# Patient Record
Sex: Female | Born: 1940 | Race: White | Hispanic: No | Smoking: Former smoker
Health system: Southern US, Community
[De-identification: ages and names within clinical notes are randomized; demographics above are authoritative.]

## PROBLEM LIST (undated history)

## (undated) DIAGNOSIS — T4145XA Adverse effect of unspecified anesthetic, initial encounter: Secondary | ICD-10-CM

## (undated) DIAGNOSIS — E785 Hyperlipidemia, unspecified: Secondary | ICD-10-CM

## (undated) DIAGNOSIS — T8859XA Other complications of anesthesia, initial encounter: Secondary | ICD-10-CM

## (undated) DIAGNOSIS — I471 Supraventricular tachycardia: Secondary | ICD-10-CM

## (undated) DIAGNOSIS — M1990 Unspecified osteoarthritis, unspecified site: Secondary | ICD-10-CM

## (undated) DIAGNOSIS — I8390 Asymptomatic varicose veins of unspecified lower extremity: Secondary | ICD-10-CM

## (undated) DIAGNOSIS — Z8719 Personal history of other diseases of the digestive system: Secondary | ICD-10-CM

## (undated) DIAGNOSIS — K469 Unspecified abdominal hernia without obstruction or gangrene: Secondary | ICD-10-CM

## (undated) DIAGNOSIS — C801 Malignant (primary) neoplasm, unspecified: Secondary | ICD-10-CM

## (undated) DIAGNOSIS — K8010 Calculus of gallbladder with chronic cholecystitis without obstruction: Secondary | ICD-10-CM

## (undated) HISTORY — PX: MELANOMA EXCISION: SHX5266

## (undated) HISTORY — DX: Hyperlipidemia, unspecified: E78.5

## (undated) HISTORY — DX: Unspecified abdominal hernia without obstruction or gangrene: K46.9

## (undated) HISTORY — PX: HERNIA REPAIR: SHX51

## (undated) HISTORY — DX: Supraventricular tachycardia: I47.1

## (undated) HISTORY — DX: Asymptomatic varicose veins of unspecified lower extremity: I83.90

## (undated) HISTORY — DX: Malignant (primary) neoplasm, unspecified: C80.1

---

## 1973-06-09 HISTORY — PX: HERNIA REPAIR: SHX51

## 1998-08-06 ENCOUNTER — Ambulatory Visit (HOSPITAL_COMMUNITY): Admission: RE | Admit: 1998-08-06 | Discharge: 1998-08-06 | Payer: Self-pay | Admitting: General Surgery

## 1998-08-13 ENCOUNTER — Ambulatory Visit (HOSPITAL_COMMUNITY): Admission: RE | Admit: 1998-08-13 | Discharge: 1998-08-13 | Payer: Self-pay | Admitting: General Surgery

## 1998-08-13 ENCOUNTER — Encounter: Payer: Self-pay | Admitting: General Surgery

## 1998-09-12 ENCOUNTER — Encounter: Admission: RE | Admit: 1998-09-12 | Discharge: 1998-10-11 | Payer: Self-pay | Admitting: Family Medicine

## 1999-01-02 ENCOUNTER — Inpatient Hospital Stay (HOSPITAL_COMMUNITY): Admission: RE | Admit: 1999-01-02 | Discharge: 1999-01-03 | Payer: Self-pay | Admitting: General Surgery

## 2000-02-03 ENCOUNTER — Ambulatory Visit: Admission: RE | Admit: 2000-02-03 | Discharge: 2000-02-03 | Payer: Self-pay | Admitting: General Surgery

## 2000-02-07 ENCOUNTER — Inpatient Hospital Stay (HOSPITAL_COMMUNITY): Admission: RE | Admit: 2000-02-07 | Discharge: 2000-02-09 | Payer: Self-pay | Admitting: General Surgery

## 2002-08-23 ENCOUNTER — Ambulatory Visit (HOSPITAL_COMMUNITY): Admission: RE | Admit: 2002-08-23 | Discharge: 2002-08-23 | Payer: Self-pay | Admitting: Family Medicine

## 2002-08-23 ENCOUNTER — Encounter: Payer: Self-pay | Admitting: Family Medicine

## 2003-01-23 ENCOUNTER — Encounter (INDEPENDENT_AMBULATORY_CARE_PROVIDER_SITE_OTHER): Payer: Self-pay | Admitting: Specialist

## 2003-01-23 ENCOUNTER — Ambulatory Visit (HOSPITAL_COMMUNITY): Admission: RE | Admit: 2003-01-23 | Discharge: 2003-01-23 | Payer: Self-pay | Admitting: Gastroenterology

## 2003-02-20 ENCOUNTER — Encounter: Payer: Self-pay | Admitting: Cardiology

## 2003-02-20 ENCOUNTER — Ambulatory Visit (HOSPITAL_COMMUNITY): Admission: RE | Admit: 2003-02-20 | Discharge: 2003-02-20 | Payer: Self-pay | Admitting: Cardiology

## 2004-02-21 ENCOUNTER — Ambulatory Visit (HOSPITAL_COMMUNITY): Admission: RE | Admit: 2004-02-21 | Discharge: 2004-02-21 | Payer: Self-pay | Admitting: General Surgery

## 2004-02-21 ENCOUNTER — Encounter (INDEPENDENT_AMBULATORY_CARE_PROVIDER_SITE_OTHER): Payer: Self-pay | Admitting: *Deleted

## 2004-11-20 ENCOUNTER — Other Ambulatory Visit: Admission: RE | Admit: 2004-11-20 | Discharge: 2004-11-20 | Payer: Self-pay | Admitting: Family Medicine

## 2005-06-09 DIAGNOSIS — I471 Supraventricular tachycardia, unspecified: Secondary | ICD-10-CM

## 2005-06-09 HISTORY — DX: Supraventricular tachycardia: I47.1

## 2005-06-09 HISTORY — DX: Supraventricular tachycardia, unspecified: I47.10

## 2006-02-17 ENCOUNTER — Encounter: Admission: RE | Admit: 2006-02-17 | Discharge: 2006-02-17 | Payer: Self-pay | Admitting: Gastroenterology

## 2006-02-27 ENCOUNTER — Ambulatory Visit: Payer: Self-pay | Admitting: Internal Medicine

## 2006-05-19 ENCOUNTER — Ambulatory Visit: Payer: Self-pay | Admitting: Cardiology

## 2007-05-20 ENCOUNTER — Encounter: Payer: Self-pay | Admitting: Internal Medicine

## 2007-05-20 DIAGNOSIS — E785 Hyperlipidemia, unspecified: Secondary | ICD-10-CM | POA: Insufficient documentation

## 2007-07-29 ENCOUNTER — Telehealth (INDEPENDENT_AMBULATORY_CARE_PROVIDER_SITE_OTHER): Payer: Self-pay | Admitting: *Deleted

## 2007-11-16 ENCOUNTER — Emergency Department (HOSPITAL_COMMUNITY): Admission: EM | Admit: 2007-11-16 | Discharge: 2007-11-16 | Payer: Self-pay | Admitting: Emergency Medicine

## 2010-10-25 NOTE — Op Note (Signed)
NAME:  Sara Bryan, Sara Bryan                     ACCOUNT NO.:  [id number]   MEDICAL RECORD NO.:  [id number]                   PATIENT TYPE:  AMB   LOCATION:  DAY                                  FACILITY:  [facility]   PHYSICIAN:  Angelia Mould. Derrell Lolling, M.D.             DATE OF BIRTH:  1941/04/19   DATE OF PROCEDURE:  02/21/2004  DATE OF DISCHARGE:                                 OPERATIVE REPORT   PREOPERATIVE DIAGNOSIS:  Melanoma of the right back, scapular area.   POSTOPERATIVE DIAGNOSIS:  Melanoma of the right back, scapular area.   OPERATION PERFORMED:  Wide local excision of melanoma of the back.   SURGEON:  Angelia Mould. Derrell Lolling, M.D.   ANESTHESIA:   INDICATIONS FOR PROCEDURE:  The patient is a 70 year old white female who  was evaluated recently for a large pigmented irregular lesion of the right  back overlying the scapular area.  This lesion is about 3 cm in diameter or  greater.  There was no ulceration or nodularity.  Dr. Terri Piedra performed a  punch biopsy and the pathology report showed that this was a Breslow's  measurement 0.22 mm depth.  No mitoses were identified.  I have examined the  patient and she was brought to the operating room for wide local excision.   DESCRIPTION OF PROCEDURE:  Following the induction of general endotracheal  anesthesia, the patient was placed in a prone position with her arms and  shoulders in neutral position.  The right posterior shoulder and scapular  area were prepped and draped in a sterile fashion.  The margins of the  melanoma were examined carefully and marked.  A 1.0 cm margin was then  marked.  A vertically oriented incision was then marked to encompass the  lesion and a 1.0 cm or greater margin in all dimensions.  This was a large  elliptical incision with a total length being about  14 or 15 cm in length.  The elliptical incision was then made and taken down into the deeper  subcutaneous tissue.  This skin and subcutaneous tissue was then  excised.  Medial and lateral margins of the specimen were marked with silk sutures and  the specimen was sent to the lab.  Skin flaps were undermined medially and  laterally to mobilize the skin.  Hemostasis was excellent and achieved with  electrocautery.  The wound was completely dry.  The skin was  closed with interrupted sutures of 3-0 nylon and skin staples.  This did not  appear to be under any unusual tension.  Clean bandage was placed.  The  patient was taken to the recovery room in stable condition.  The estimated  blood loss was about 15 to 20 mL.  Complications were none.  Sponge, needle  and instrument counts were correct.  Angelia Mould. Derrell Lolling, M.D.    HMI/MEDQ  D:  02/21/2004  T:  02/21/2004  Job:  [id number]   cc:   Gelene Mink A. Worthy Rancher, M.D.  [address] [place]  [place] [address]  Fax: [phone]   Meredith Staggers, M.D.  [address], [address]  [place]  [place] [address]  Fax: [phone]

## 2010-10-25 NOTE — Assessment & Plan Note (Signed)
[facility]                               PULMONARY OFFICE NOTE   NAME:Sara Bryan, Sara Bryan                  MRN:          [id number]  DATE:02/27/2006                            DOB:          [birth year]    REFERRING PHYSICIAN:  Graylin Shiver, M.D.   REASON FOR CONSULTATION:  Lung nodule.   HISTORY:  A 70 year old white female, remote smoker, who developed a  melanoma two years ago (unknown level) requiring resection over her right  shoulder posteriorly by Dr. Derrell Lolling, who now comes in after virtual  colonoscopy revealed a left lower lobe nodule, less than of only a  millimeter.  There were no other abnormalities seen.  The patient states she  is feeling fine, trying to lose weight with little success.  She denies any  pleuritic or exertional chest pain, cough, fevers, chills, sweats or leg  swelling or significant history of arthritis.   PAST MEDICAL HISTORY:  1. Significant for hyperlipidemia.  2. Melanoma as noted above.  3. Blepharoplasty.  4. She is also status post C-section.   ALLERGIES:  None known.   MEDICATIONS:  These on the worksheet correct, dated February 27, 2006.  Does not include any respiratory medications.   SOCIAL HISTORY:  She quit smoking 17 years ago.  She works in Air[employer].  She  denies any usual travel, pet or hobby exposure or occupational exposure.   FAMILY HISTORY:  Positive for rheumatism in her father and breast cancer in  her mother.   REVIEW OF SYSTEMS:  Taken in detail on the worksheet and negative except as  outlined above.   PHYSICAL EXAMINATION:  An obese, pleasant, elderly white female in no acute  distress.  She has stable vital signs.  HEENT:  Unremarkable.  Pharynx clear.  LUNGS:  Lung fields are clear bilaterally to auscultation and percussion.  HEART:  Regular rhythm without murmur, rub, or gallop.  ABDOMEN:  Soft, benign.  EXTREMITIES:  Warm without calf tenderness, cyanosis or clubbing.   LABORATORY DATA:  Oxygen saturation 96% on room air.  CT scan reviewed from  [facility] showing a single 1 mm left lower lobe nodule.   IMPRESSION:  Asymptomatic and extremely tiny left lower lobe nodule that is  not likely to be seen on any chest x-ray either now or using past films.  The best we can do is, now that we know about the nodule since she has been  a smoker, has a positive family history of cancer and melanoma, to repeat a  chest CT in about three months and then after that perhaps at a year as  recommended by radiology.  I assured her and her daughter that it is not  uncommon at all to see incidental tiny nodules and because it is so small  the only option is to do an excisional biopsy, which I do not believe would  be warranted.  The only other option to the above is for [place] to do an  excisional biopsy now, which I think would not be warranted based on the  risk/benefit analysis.  Charlaine Dalton. Sherene Sires, MD, [facility]   MBW/MedQ  DD:  02/27/2006  DT:  03/03/2006  Job #:  [id number]   cc:   Graylin Shiver, M.D.  Tally Joe, M.D.

## 2010-10-25 NOTE — Op Note (Signed)
[facility]  Patient:    KEIGHLEY, DECKMAN                  MRN: [id number] Proc. Date: 02/07/00 Adm. Date:  [id number] Attending:  Brandy Hale CC:         Meredith Staggers, M.D.   Operative Report  PREOPERATIVE DIAGNOSES:  Recurrent ventral hernia.  POSTOPERATIVE DIAGNOSIS:  Recurrent ventral hernia.  OPERATION PERFORMED:  Repair of recurrent ventral incisional hernia, with polypropylene mesh.  SURGEON:  Angelia Mould. Derrell Lolling, M.D.  FIRST ASSISTANT:  Timothy E. Earlene Plater, M.D.  OPERATIVE INDICATIONS:  This is a 70 year old white female, who has had multiple operations in the past.  She has had multiple cesarean sections.  She had a ventral hernia repair many years ago.  I have repaired a ventral hernia through a midline incision in 1998, which was very complex.  She also had a ventral hernia in the left lower quadrant, lateral to the previous repair (which was repaired with mesh).  She presents now with a new ventral hernia in the lower midline, just in the suprapubic area; and has a hernia in that location.  She is brought to the operating room electively  for repair of her symptomatic ventral hernia.  OPERATIVE TECHNIQUE:  Following the induction of general endotracheal anesthesia, the patients abdomen was prepped and draped in the sterile fashion.  Lower midline incision was made, essentially from the umbilicus down to the symphysis pubis.  Dissection was carried down into the subcutaneous tissue.  We identified at least two sheets of mesh; one vertically in the midline and one transversely in the left lower quadrant.  We found a complicated hernia sac between the lower edge of the mesh and symphysis pubis. We opened the hernia sac and found that it had multiple chambers.  We debrided the sac back and opened up all the chambers and debrided the epithelial tissue.  I could palpate inside the abdomen and did not feel any hernia  defect in the left lower quadrant or the right lower quadrant otherwise.  Once we debrided back the sac back to the mesh above and the fascia below, we closed the mesh above to the fascia below with interrupted sutures of 0 Prolene. This was closed transversely  and the mesh was not exposed on the bowel. There was peritoneal surface on the intestinal tract.  We undermined the subcutaneous tissues circumferentially, well out to identifiable lateral abdominal wall fascia bilaterally, inferiorly all the way down to the level of the inguinal ligament and over the top of the symphysis pubis.  Above we extended all the way up through the upper end of the mesh near the umbilicus.  Hemostasis was excellent, and achieved with electrocautery.  The wound was irrigated with saline.  We brought a large sheet of polypropylene mesh to the wound.  We cut a piece of mesh that was fairly circular in dimension, approximately 10 inches x 10 inches in dimension.  This was sutured in place with about 15-20 interrupted mattress sutures of 0 Prolene, and the mesh was further secured with a 5 mm scroll-type tacking device.  This covered the defect quite well. We had the mesh overlapping the symphysis pubis inferiorly and tacked down to the inguinal ligament on both sides and well out on the abdominal fascia laterally and superiorly.  The wound was irrigated with saline.  Two 10 mm Jackson-Pratt drains were placed on top of the mesh and brought  out through separate stab incisions superiorly.  The drains were sutured to the skin with nylon sutures, and then connected to suction bulbs.  The subcutaneous tissue was approximated with interrupted sutures of 2-0 Vicryl.  The skin was closed with skin staples. Clean bandages were placed and the patient taken to the recovery room in stable condition.  ESTIMATED BLOOD LOSS:  150 cc.  COMPLICATIONS:  None.  Sponge, needle and instrument counts were correct. DD:   02/07/00 TD:  02/07/00 Job: [id number]

## 2010-10-25 NOTE — Op Note (Signed)
   NAME:  Sara Bryan, Sara Bryan                     ACCOUNT NO.:  [id number]   MEDICAL RECORD NO.:  [id number]                   PATIENT TYPE:  AMB   LOCATION:  ENDO                                 [facility]ACILITY[facility]  [facility]   PHYSICIAN:  Graylin Shiver, M.D.                DATE OF BIRTH:  1940-10-24   DATE OF PROCEDURE:  01/23/2003  DATE OF DISCHARGE:                                 OPERATIVE REPORT   PROCEDURE:  Colonoscopy with polypectomy.   INDICATIONS FOR PROCEDURE:  Screening.   Informed consent was obtained after explanation of the risks of bleeding,  infection, and perforation.   PREMEDICATION:  Fentanyl 62.5 mcg IV, Versed 6 mg IV.   PROCEDURE:  With the patient in the left lateral decubitus position a rectal  exam was performed and no masses were felt.  The Olympus colonoscope was  inserted into the rectum and advanced throughout the colon.  The colon was  very tortuous.  The scope was advanced into the region of the proximal  transverse colon but despite all maneuvers with changing position of the  patient and applying pressure at different points of the abdomen, I could  not advance the scope beyond a certain point.  The patient has a midline  surgical scar from prior C-sections and incisional hernia repairs and I  believe that she must have adhesions which are adhering to the colon  preventing passage of the scope.  The scope was bought out.  The transverse  colon looked normal.  The descending colon looked normal.  In the sigmoid at  20 cm there was a 1-cm sessile polyp.  This was snared and removed by snare  cautery technique.  We needed to give the patient Glucagon prior to this  because of so much colonic spasm in the area.  The rectum looked normal.  She tolerated the procedure well without complications.   IMPRESSION:  Colonoscopy to the proximal transverse colon revealing a  sigmoid polyp which was snared and removed.   PLAN:  The pathology will be checked.  I will  order an air contrast barium  enema for 6-8 weeks from now.                                               Graylin Shiver, M.D.    SFG/MEDQ  D:  01/23/2003  T:  01/23/2003  Job:  [id number]   cc:   Stacie Acres. White, M.D.  [address]  [place]  [place] [address]  Fax: [phone]

## 2010-11-14 ENCOUNTER — Encounter (INDEPENDENT_AMBULATORY_CARE_PROVIDER_SITE_OTHER): Payer: Self-pay | Admitting: General Surgery

## 2011-01-23 ENCOUNTER — Encounter: Payer: Self-pay | Admitting: *Deleted

## 2011-01-28 ENCOUNTER — Encounter: Payer: Self-pay | Admitting: Cardiovascular Disease

## 2011-01-28 ENCOUNTER — Ambulatory Visit (INDEPENDENT_AMBULATORY_CARE_PROVIDER_SITE_OTHER): Payer: Medicare Other | Admitting: Cardiovascular Disease

## 2011-01-28 VITALS — BP 120/82 | HR 58 | Ht 66.5 in | Wt 188.0 lb

## 2011-01-28 DIAGNOSIS — I498 Other specified cardiac arrhythmias: Secondary | ICD-10-CM

## 2011-01-28 DIAGNOSIS — I471 Supraventricular tachycardia: Secondary | ICD-10-CM

## 2011-01-28 MED ORDER — PROPRANOLOL HCL 10 MG PO TABS: 10.0000 mg | ORAL_TABLET | Freq: Four times a day (QID) | ORAL | Status: DC | PRN

## 2011-01-28 NOTE — Progress Notes (Signed)
Sara Bryan Date of Birth  1941-05-09 [facility] [address]     [address] [place], [place]  [address] [phone]  Fax  [phone]  History of Present Illness:  Sara Bryan is a 70 year old with a history of supraventricular tachycardia. She is overall doing fairly well. She admits to eating a little bit of extra salt recently. Her blood pressure is a little higher than normal.  Overall she's feeling well. She's not had any episodes of chest pain, shortness breath, syncope, presyncope or palpitations.  Current Outpatient Prescriptions on File Prior to Visit  Medication Sig Dispense Refill  . Ascorbic Acid (VITAMIN C PO) Take by mouth daily. Take in winter       . CALCIUM PO Take by mouth daily.        . Cholecalciferol (VITAMIN D PO) Take 5,000 Units by mouth daily.        . Coenzyme Q10 (CO Q-10 PO) Take by mouth daily.        . Multiple Vitamin (MULTI-VITAMIN PO) Take by mouth daily.        . Omega-3 Fatty Acids (FISH OIL PO) Take by mouth daily.        Marland Kitchen POTASSIUM PO Take by mouth daily.        Marland Kitchen PROPRANOLOL HCL PO Take 1 tablet by mouth as needed.        . rosuvastatin (CRESTOR) 10 MG tablet Take 10 mg by mouth daily.          Allergies  Allergen Reactions  . Aspirin     Past Medical History  Diagnosis Date  . Hernia   . Cancer     Melonoma  . Supraventricular tachycardia   . Hyperlipidemia     Past Surgical History  Procedure Date  . Cesarean section   . Hernia repair   . Melanoma excision     from back in 2007    History  Smoking status  . Former Smoker -- 1.0 packs/day for 20 years  . Types: Cigarettes  . Quit date: 01/23/1980  Smokeless tobacco  . Never Used    History  Alcohol Use No    Family History  Problem Relation Age of Onset  . Cancer Mother     Breast  . Arthritis Father     RA  . Heart disease Father     cardiomegaly    Reviw of Systems:  Reviewed in the HPI.  All other  systems are negative.  Physical Exam: BP 120/82  Pulse 58  Ht 5' 6.5" (1.689 m)  Wt 188 lb (85.276 kg)  BMI 29.89 kg/m2 The patient is alert and oriented x 3.  The mood and affect are normal.   Skin: warm and dry.  Color is normal.    HEENT:   the sclera are nonicteric.  The mucous membranes are moist.  The carotids are 2+ without bruits.  There is no thyromegaly.  There is no JVD.    Lungs: clear.  The chest wall is non tender.    Heart: regular rate with a normal S1 and S2.  There are no murmurs, gallops, or rubs. The PMI is not displaced.     Abdomen: good bowel sounds.  There is no guarding or rebound.  There is no hepatosplenomegaly or tenderness.  There are no masses.   Extremities:  no clubbing, cyanosis, or edema.  The legs are without rashes.  The distal pulses are intact.  Neuro:  Cranial nerves II - XII are intact.  Motor and sensory functions are intact.    The gait is normal.  ECG: Sinus Bradycardia.  Assessment / Plan:

## 2011-01-28 NOTE — Assessment & Plan Note (Signed)
Sara Bryan is doing very well from a cardiac standpoint. She's not had any recurrent episodes of supraventricular tachycardia. We have refilled her prescription for Inderal. We've reviewed the Valsalva maneuver and the diving reflex. I'll see her again in one year for followup visit.

## 2011-01-29 ENCOUNTER — Encounter: Payer: Self-pay | Admitting: Cardiovascular Disease

## 2011-03-05 ENCOUNTER — Other Ambulatory Visit: Payer: Self-pay

## 2011-03-06 LAB — DIFFERENTIAL
Eosinophils Relative: 1
Lymphocytes Relative: 22
Lymphs Abs: 2.3
Neutro Abs: 7.2

## 2011-03-06 LAB — COMPREHENSIVE METABOLIC PANEL
AST: 30
Albumin: 3.6
CO2: 25
Calcium: 9
Creatinine, Ser: 0.74
GFR calc Af Amer: >60
GFR calc non Af Amer: >60

## 2011-03-06 LAB — MAGNESIUM: Magnesium: 2.2

## 2011-03-06 LAB — POCT CARDIAC MARKERS
CKMB, poc: 3.2
Troponin i, poc: <0.05

## 2011-03-06 LAB — CBC
MCHC: 34
MCV: 87.7
Platelets: 329

## 2011-03-07 ENCOUNTER — Other Ambulatory Visit: Payer: Self-pay | Admitting: Gastroenterology

## 2011-03-07 DIAGNOSIS — Q438 Other specified congenital malformations of intestine: Secondary | ICD-10-CM

## 2011-03-07 DIAGNOSIS — Z86010 Personal history of colonic polyps: Secondary | ICD-10-CM

## 2011-03-25 ENCOUNTER — Encounter (INDEPENDENT_AMBULATORY_CARE_PROVIDER_SITE_OTHER): Payer: Self-pay | Admitting: General Surgery

## 2011-03-25 ENCOUNTER — Ambulatory Visit (INDEPENDENT_AMBULATORY_CARE_PROVIDER_SITE_OTHER): Payer: Medicare Other | Admitting: General Surgery

## 2011-03-25 VITALS — BP 142/92 | HR 76 | Temp 97.4°F | Resp 16 | Ht 66.5 in | Wt 189.4 lb

## 2011-03-25 DIAGNOSIS — C4361 Malignant melanoma of right upper limb, including shoulder: Secondary | ICD-10-CM | POA: Insufficient documentation

## 2011-03-25 DIAGNOSIS — C4360 Malignant melanoma of unspecified upper limb, including shoulder: Secondary | ICD-10-CM

## 2011-03-25 NOTE — Progress Notes (Signed)
Chief Complaint  Patient presents with  . Other    est pt new prob- eval melanoma of right elbow    HPI Sara Bryan is a 70 y.o. female.    This patient is a 70 year old Caucasian female who has been a patient of mine for many years. 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 This was a 0.22 mm thickness and she did well from that. She is followed regularly by Dr. Para Skeans. Her last surveillance exam was in July of 2012.  One month ago she noticed a reddish irregular nodule on the lateral aspect of her right elbow. One of Dr. Dorita Sciara physician extenders did a shave biopsy of this on March 05, 2011. Pathology report shows a malignant melanoma, Breslow's measurement 1.75 mm, and deep margin involved. Progression, ulceration, and vascular invasion are absent. Pathologic stage TXXVIII, in fact.  The patient is aware of her diagnosis. She is appropriately concerned. She does not note any other lesions like this on her body. She is here to discuss surgical management. HPI  Past Medical History  Diagnosis Date  . Hernia   . Cancer     Melonoma  . Supraventricular tachycardia   . Hyperlipidemia     Past Surgical History  Procedure Date  . Cesarean section   . Melanoma excision     from back in 2007  . Hernia repair     abd wall    Family History  Problem Relation Age of Onset  . Cancer Mother     Breast  . Arthritis Father     RA  . Heart disease Father     cardiomegaly    Social History History  Substance Use Topics  . Smoking status: Former Smoker -- 1.0 packs/day for 20 years    Types: Cigarettes    Quit date: 01/23/1980  . Smokeless tobacco: Never Used  . Alcohol Use: No    Allergies  Allergen Reactions  . Aspirin     Current Outpatient Prescriptions  Medication Sig Dispense Refill  . Ascorbic Acid (VITAMIN C PO) Take by mouth daily. Take in winter       . CALCIUM PO Take by mouth daily.        . Cholecalciferol  (VITAMIN D PO) Take 5,000 Units by mouth daily.        . Coenzyme Q10 (CO Q-10 PO) Take by mouth daily.        . Multiple Vitamin (MULTI-VITAMIN PO) Take by mouth daily.        . Omega-3 Fatty Acids (FISH OIL PO) Take by mouth daily.        Marland Kitchen POTASSIUM PO Take by mouth daily.        . propranolol (INDERAL) 10 MG tablet Take 1 tablet (10 mg total) by mouth 4 (four) times daily as needed.  30 tablet  12  . PROPRANOLOL HCL PO Take 1 tablet by mouth as needed.        . rosuvastatin (CRESTOR) 10 MG tablet Take 10 mg by mouth daily.          Review of Systems Review of Systems  Constitutional: Negative.   HENT: Negative.   Respiratory: Negative.   Cardiovascular: Negative.   Genitourinary: Negative.   Musculoskeletal: Negative.   Skin: Positive for color change and wound. Negative for pallor and rash.  Neurological: Negative.   Hematological: Negative.   Psychiatric/Behavioral: Negative.     Blood pressure  142/92, pulse 76, temperature 97.4 F (36.3 C), temperature source Temporal, resp. rate 16, height 5' 6.5" (1.689 m), weight 189 lb 6.4 oz (85.911 kg).  Physical Exam Physical Exam  Constitutional: She is oriented to person, place, and time. She appears well-developed and well-nourished. No distress.  HENT:  Head: Normocephalic and atraumatic.  Nose: Nose normal.  Mouth/Throat: No oropharyngeal exudate.  Eyes: Conjunctivae and EOM are normal. Pupils are equal, round, and reactive to light. Right eye exhibits no discharge. Left eye exhibits no discharge. No scleral icterus.  Neck: Normal range of motion. Neck supple. No JVD present. No tracheal deviation present. No thyromegaly present.  Cardiovascular: Normal rate, regular rhythm, normal heart sounds and intact distal pulses.  Exam reveals no gallop and no friction rub.   No murmur heard. Pulmonary/Chest: Breath sounds normal. No stridor. No respiratory distress. She has no wheezes. She has no rales.   She exhibits no tenderness.    Abdominal: Soft. Bowel sounds are normal. She exhibits no distension and no mass. There is no tenderness. There is no rebound and no guarding.    Musculoskeletal: Normal range of motion. She exhibits no edema and no tenderness.  Lymphadenopathy:    She has no cervical adenopathy.  Neurological: She is alert and oriented to person, place, and time. She exhibits normal muscle tone. Coordination normal.  Skin: Skin is warm and dry. No rash noted. She is not diaphoretic. No erythema. No pallor.     Psychiatric: She has a normal mood and affect. Her behavior is normal. Judgment and thought content normal.    Data Reviewed I reviewed the pathology report and the note from Dr. Arvella Merles office.  Assessment    Malignant melanoma, lateral aspect right elbow, Breslow's measurement  1.75 mm, deep margin involved.  She will need  wide local excision of this as well as sentinel lymph node biopsy.  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  Status post multiple ventral hernia repairs with mesh.  Status post cesarean section x2.    Plan    Schedule for a lymphoscintigram to be sure that the melanoma maps to the right axilla.  Tentatively schedule for wide local excision of the melanoma of the right elbow and right axillary sentinel node biopsy.  If the lymphoscintigram maps to another lymph node, we will alter the treatment plan and discuss with her.  I have discussed the indications and details of surgery with the patient and her daughter. Risks and complications have been outlined, including but limited to bleeding, infection, reoperation for positive margins, reoperation for positive nodes, nerve damage chronic pain or numbness, arm swelling, cardiac pulmonary and thromboembolic problems. She seems to understand all these issues well. All of her questions were answered. She is in full agreement with this plan.  I have told her we may refer her  to a medical oncologist postop, depending on findings.       Sara Bryan M 03/25/2011, 3:06 PM

## 2011-03-25 NOTE — Patient Instructions (Signed)
You will be scheduled for a x-ray called a lymphoscintigram of the right upper extremity. 10-14 days after that x-ray we will perform your surgery, which will involve wide local excision of the melanoma of the right elbow and a sentinel lymph node biopsy of the right axilla. I will call you if the lymphoscintigram changes  that plan.  Melanoma Melanoma is the least common, but most dangerous, form of skin cancer. This is because it can spread (metastasize) to other organs and can be life-threatening. Melanoma is a cancerous (malignant) tumor that begins in a certain type of cells, called melanocytes. Melanocytes are the cells that produce the color (pigment) called melanin. Melanin colors our skin, hair, eyes, and moles. CAUSES The exact cause of melanoma is unknown. You may have a higher risk if you:  Spend or have spent a lot of time in the sun. This includes sunlamp and tanning booth exposure.   Have had sunburns. This put you at a particularly increased risk for melanoma. The more blistering sunburns a person has, the higher the risk.   Spend time in parts of the world with more intense sunlight.   Have fair skin that does not tan easily. You may have a lower risk if you have a darker skin color. However, people with darker skin can get melanoma, especially on the hands and feet (acral areas).   Have a close relative (parent, sibling) who has melanoma.   Have a large number of skin moles (more than 100).  SYMPTOMS A skin mole is suspicious if it has any of these 5 traits. This is called the ABCDE's of melanoma:  Asymmetry: Irregular shape, not simply round or oval.   Border: Edge of the mole is irregular, not smooth.   Color: Mole may have multiple colors in it, including brown, black, blue, red, or tan.   Diameter: More than 0.2 inches (6 mm) across.   Evolving: Any unusual change or symptoms in the mole, such as pain, itching, stinging, sensitivity, or bleeding.  A mole that is  noticeably changing in appearance, or any new mole, should be checked for melanoma. In general, people develop new moles until age 42. New moles after this age should be brought to the attention of your caregiver. DIAGNOSIS Your caregiver can look at your skin and find lesions or moles that may be suspicious. A patient may also notice a mole with symptoms or a mole that does not look like most of the other moles on his or her body. This is called the "ugly duckling" sign. A tissue sample (biopsy) examined under a microscope is needed to determine if it is melanoma. The size and extent of the biopsy will depend on the location, size, and appearance of the skin lesion or mole. The biopsy can also reveal whether melanoma has spread to deeper layers of the skin. TREATMENT Surgery to completely remove the melanoma is required. Lymph nodes may also be removed. If the melanoma has spread to other organs, such as the liver, lungs, bone, or brain, cancer-fighting drugs (chemotherapy) must be used. Your caregiver will discuss your treatment options with you. You can ask about being included in a clinical trial to evaluate new forms of treatment. Melanoma can occasionally recur years after the initial diagnosis. If you have melanoma, you will need follow-up visits with your caregiver for many years. PREVENTION Risk for melanoma can be reduced by minimizing sun exposure. Practice the 3 S's:  Slip on a shirt.   Slop  on sunscreen.   Slap on a hat.  Do not spend time in the sun during peak midafternoon hours. Sunscreen/sunblock with SPF 30 or higher and UVA/UVB block should be applied regularly. You should do this even during brief exposure to sunlight. You should also do this on cloudy days and in winter, even though the perceived sunlight is less. Always avoid sunburn! Wear sunglasses that block UV light. Be sure to see your caregiver if you have any new or changing moles. HOME CARE INSTRUCTIONS  Follow wound  care instructions after surgical removal of your melanoma.   Practice good sun avoidance and protective measures as described above.   Let your close family members (parents, children, siblings) know about your diagnosis. This puts them at a higher risk of getting melanoma than the general population.  SEEK MEDICAL CARE IF:  You notice any new moles, or you have any moles that are changing.   You have had a melanoma removed and you notice a new growth near the same location.   You have had a melanoma removed and you experience any new or unexplained health problems.  Document Released: 05/26/2005 Document Re-Released: 11/13/2009 [facility] Patient Information 2011 [place], [place].

## 2011-03-26 ENCOUNTER — Other Ambulatory Visit (INDEPENDENT_AMBULATORY_CARE_PROVIDER_SITE_OTHER): Payer: Self-pay | Admitting: General Surgery

## 2011-03-26 DIAGNOSIS — C439 Malignant melanoma of skin, unspecified: Secondary | ICD-10-CM

## 2011-03-27 ENCOUNTER — Encounter (HOSPITAL_COMMUNITY)
Admission: RE | Admit: 2011-03-27 | Discharge: 2011-03-27 | Disposition: A | Discharge status: Home / Self Care | Payer: Medicare Other | Source: Ambulatory Visit | Attending: General Surgery | Admitting: General Surgery

## 2011-03-27 DIAGNOSIS — C4361 Malignant melanoma of right upper limb, including shoulder: Secondary | ICD-10-CM

## 2011-03-27 DIAGNOSIS — C4360 Malignant melanoma of unspecified upper limb, including shoulder: Secondary | ICD-10-CM | POA: Insufficient documentation

## 2011-03-27 MED ORDER — TECHNETIUM TC 99M SULFUR COLLOID FILTERED
500.0000 | Freq: Once | INTRAVENOUS | Status: AC | PRN
  Administered 2011-03-27: 500 via INTRADERMAL

## 2011-03-31 ENCOUNTER — Telehealth (INDEPENDENT_AMBULATORY_CARE_PROVIDER_SITE_OTHER): Payer: Self-pay

## 2011-03-31 NOTE — Telephone Encounter (Signed)
Pt called re: having a vertual colonoscopy prior to her 11-7 melanoma. Per Dr Derrell Lolling unless pt is having GI symptoms or GI abnormality the colonoscopy should wait until after her melanoma surgery. I have lmom for pt to call. There was no name on answering machine.

## 2011-03-31 NOTE — Telephone Encounter (Signed)
Pt returning my call. Pt will r/s colonscopy since it is ltf appt.

## 2011-04-02 ENCOUNTER — Telehealth (INDEPENDENT_AMBULATORY_CARE_PROVIDER_SITE_OTHER): Payer: Self-pay

## 2011-04-02 NOTE — Telephone Encounter (Signed)
Tresa Endo, pt's daughter called to let us know they saw Dr Terri Piedra EA:[id number] on pts chest. He advised them that area was just a angioma but could be removed at time of melanoma surgery if Dr Derrell Lolling or pt wanted area gone. Pt wants to leave it up to Dr Derrell Lolling.

## 2011-04-07 ENCOUNTER — Other Ambulatory Visit: Payer: Medicare Other

## 2011-04-09 ENCOUNTER — Encounter (HOSPITAL_COMMUNITY): Payer: Self-pay

## 2011-04-14 ENCOUNTER — Other Ambulatory Visit: Payer: Self-pay

## 2011-04-14 ENCOUNTER — Encounter (HOSPITAL_COMMUNITY)
Admission: RE | Admit: 2011-04-14 | Discharge: 2011-04-14 | Disposition: A | Discharge status: Home / Self Care | Payer: Medicare Other | Source: Ambulatory Visit | Attending: General Surgery | Admitting: General Surgery

## 2011-04-14 ENCOUNTER — Encounter (HOSPITAL_COMMUNITY): Payer: Self-pay

## 2011-04-14 LAB — COMPREHENSIVE METABOLIC PANEL
ALT: 24 U/L (ref 0–35)
Albumin: 3.6 g/dL (ref 3.5–5.2)
Alkaline Phosphatase: 89 U/L (ref 39–117)
Glucose, Bld: 78 mg/dL (ref 70–99)
Potassium: 4 mEq/L (ref 3.5–5.1)
Sodium: 144 mEq/L (ref 135–145)
Total Protein: 7.3 g/dL (ref 6.0–8.3)

## 2011-04-14 LAB — DIFFERENTIAL
Basophils Relative: 0 % (ref 0–1)
Lymphs Abs: 3.2 10*3/uL (ref 0.7–4.0)
Monocytes Relative: 10 % (ref 3–12)
Neutro Abs: 7.9 10*3/uL — ABNORMAL HIGH (ref 1.7–7.7)
Neutrophils Relative %: 62 % (ref 43–77)

## 2011-04-14 LAB — URINALYSIS, ROUTINE W REFLEX MICROSCOPIC
Hgb urine dipstick: NEGATIVE
Specific Gravity, Urine: 1.026 (ref 1.005–1.030)
Urobilinogen, UA: 1 mg/dL (ref 0.0–1.0)

## 2011-04-14 LAB — CBC
Hemoglobin: 15.4 g/dL — ABNORMAL HIGH (ref 12.0–15.0)
MCHC: 34.4 g/dL (ref 30.0–36.0)
RBC: 4.98 MIL/uL (ref 3.87–5.11)
WBC: 12.6 10*3/uL — ABNORMAL HIGH (ref 4.0–10.5)

## 2011-04-14 NOTE — Pre-Procedure Instructions (Signed)
20 Sara Bryan  04/14/2011   Your procedure is scheduled [id number], November 7  Report to [facility] at 11 AM.  Call this number if you have problems the morning of surgery: [phone]   Remember:   Do not eat food:After Midnight.  Do not drink clear liquids: 4 Hours before arrival.  Take these medicines the morning of surgery with A SIP OF WATER: none   Do not wear jewelry, make-up or nail polish.  Do not wear lotions, powders, or perfumes. You may wear deodorant.  Do not shave 48 hours prior to surgery.  Do not bring valuables to the hospital.  Contacts, dentures or bridgework may not be worn into surgery.  Leave suitcase in the car. After surgery it may be brought to your room.  For patients admitted to the hospital, checkout time is 11:00 AM the day of discharge.   Patients discharged the day of surgery will not be allowed to drive home.  Name and phone number of your driver:daughter,kelly  Special Instructions: CHG Shower Use Special Wash: 1/2 bottle night before surgery and 1/2 bottle morning of surgery.   Please read over the following fact sheets that you were given: Pain Booklet, Coughing and Deep Breathing, MRSA Information and Surgical Site Infection Prevention

## 2011-04-15 ENCOUNTER — Other Ambulatory Visit (INDEPENDENT_AMBULATORY_CARE_PROVIDER_SITE_OTHER): Payer: Self-pay | Admitting: General Surgery

## 2011-04-15 MED ORDER — CEFAZOLIN SODIUM-DEXTROSE 2-3 GM-% IV SOLR
2.0000 g | INTRAVENOUS | Status: DC
  Filled 2011-04-15: qty 50

## 2011-04-15 NOTE — H&P (Signed)
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  Other       est pt new prob- eval melanoma of right elbow      HPI Sara Bryan is a 70 y.o. female.     This patient is a 70 year old Caucasian female who has been a patient of mine for many years. 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 This was a 0.22 mm thickness and she did well from that. She is followed regularly by Dr. Para Skeans. Her last surveillance exam was in July of 2012.   One month ago she noticed a reddish irregular nodule on the lateral aspect of her right elbow. One of Dr. Dorita Sciara physician extenders did a shave biopsy of this on March 05, 2011. Pathology report shows a malignant melanoma, Breslow's measurement 1.75 mm, and deep margin involved. Progression, ulceration, and vascular invasion are absent. Pathologic stage TXXVIII, in fact.   The patient is aware of her diagnosis. She is appropriately concerned. She does not note any other lesions like this on her body. She is here to discuss surgical management. HPI    Past Medical History   Diagnosis  Date   .  Hernia     .  Cancer         Melonoma   .  Supraventricular tachycardia     .  Hyperlipidemia         Past Surgical History   Procedure  Date   .  Cesarean  section     .  Melanoma excision         from back in 2007   .  Hernia repair         abd wall       Family History   Problem  Relation  Age of Onset   .  Cancer  Mother         Breast   .  Arthritis  Father         RA   .  Heart disease  Father         cardiomegaly      Social History History   Substance Use Topics   .  Smoking status:  Former Smoker -- 1.0 packs/day for 20 years       Types:  Cigarettes  Quit date:  01/23/1980   .  Smokeless tobacco:  Never Used   .  Alcohol Use:  No       Allergies   Allergen  Reactions   .  Aspirin         Current Outpatient Prescriptions   Medication  Sig  Dispense  Refill   .  Ascorbic Acid (VITAMIN C PO)  Take by mouth daily. Take in winter          .  CALCIUM PO  Take by mouth daily.           .  Cholecalciferol (VITAMIN D PO)  Take 5,000 Units by mouth daily.           .  Coenzyme Q10 (CO Q-10 PO)  Take by mouth daily.           .  Multiple Vitamin (MULTI-VITAMIN PO)  Take by mouth daily.           .  Omega-3 Fatty Acids (FISH OIL PO)  Take by mouth daily.           Marland Kitchen  POTASSIUM PO  Take by mouth daily.           .  propranolol (INDERAL) 10 MG tablet  Take 1 tablet (10 mg total) by mouth 4 (four) times daily as needed.   30 tablet   12   .  PROPRANOLOL HCL PO  Take 1 tablet by mouth as needed.           .  rosuvastatin (CRESTOR) 10 MG tablet  Take 10 mg by mouth daily.              Review of Systems Review of Systems  Constitutional: Negative.   HENT: Negative.   Respiratory: Negative.   Cardiovascular: Negative.   Genitourinary: Negative.   Musculoskeletal: Negative.   Skin: Positive for color change and wound. Negative for pallor and rash.  Neurological: Negative.   Hematological: Negative.   Psychiatric/Behavioral: Negative.     Blood pressure 142/92, pulse 76, temperature 97.4 F (36.3 C), temperature source Temporal, resp. rate 16, height 5' 6.5" (1.689 m), weight 189 lb 6.4 oz (85.911  kg).   Physical Exam Physical Exam  Constitutional: She is oriented to person, place, and time. She appears well-developed and well-nourished. No distress.  HENT:   Head: Normocephalic and atraumatic.   Nose: Nose normal.   Mouth/Throat: No oropharyngeal exudate.  Eyes: Conjunctivae and EOM are normal. Pupils are equal, round, and reactive to light. Right eye exhibits no discharge. Left eye exhibits no discharge. No scleral icterus.  Neck: Normal range of motion. Neck supple. No JVD present. No tracheal deviation present. No thyromegaly present.  Cardiovascular: Normal rate, regular rhythm, normal heart sounds and intact distal pulses.  Exam reveals no gallop and no friction rub.    No murmur heard. Pulmonary/Chest: Breath sounds normal. No stridor. No respiratory distress. She has no wheezes. She has no rales.   She exhibits no tenderness.  Abdominal: Soft. Bowel sounds are normal. She exhibits no distension and no mass. There is no tenderness. There is no rebound and no guarding.    Musculoskeletal: Normal range of motion. She exhibits no edema and no tenderness.  Lymphadenopathy:    She has no cervical adenopathy.  Neurological: She is alert and oriented to person, place, and time. She exhibits normal muscle tone. Coordination normal.  Skin: Skin is warm and dry. No rash noted. She is not  diaphoretic. No erythema. No pallor.     Psychiatric: She has a normal mood and affect. Her behavior is normal. Judgment and thought content normal.    Data Reviewed I reviewed the pathology report and the note from Dr. Arvella Merles office.   Assessment   Malignant melanoma, lateral aspect right elbow, Breslow's measurement  1.75 mm, deep margin involved.   She will need  wide local excision of this as well as sentinel lymph node biopsy.   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   Status post multiple ventral hernia repairs with  mesh.   Status post cesarean section x2.   Plan Schedule for a lymphoscintigram to be sure that the melanoma maps to the right axilla.   Tentatively schedule for wide local excision of the melanoma of the right elbow and right axillary sentinel node biopsy.   If the lymphoscintigram maps to another lymph node, we will alter the treatment plan and discuss with her.   I have discussed the indications and details of surgery with the patient and her daughter. Risks and complications have been outlined, including but limited to bleeding, infection, reoperation for positive margins, reoperation for positive nodes, nerve damage chronic pain or numbness, arm swelling, cardiac pulmonary and thromboembolic problems. She seems to understand all these issues well. All of her questions were answered. She is in full agreement with this plan.   I have told her we may refer her to a medical oncologist postop, depending on findings.       Remon Quinto M 03/25/2011, 3:06 PM                Not recorded         Orders Placed This Encounter       Future Orders    NM Lymph/Gland [[id number] Custom]    Expires: 05/24/12         Patient Instructions     You will be scheduled for a x-ray called a lymphoscintigram of the right upper extremity. 10-14 days after that x-ray we will perform your surgery, which will involve wide local excision of the melanoma of the right elbow and a sentinel lymph node biopsy of the right axilla. I will call you if the lymphoscintigram changes  that plan.   Melanoma  Melanoma is the least common, but most dangerous, form of skin cancer. This is because it can spread (metastasize) to other organs and can be life-threatening. Melanoma is a cancerous (malignant) tumor that begins in a certain type of cells, called melanocytes. Melanocytes are the cells that produce the color (pigment) called melanin. Melanin colors our skin, hair, eyes, and  moles. CAUSES  The exact cause of melanoma is unknown. You may have a higher risk if you: Spend or have spent a lot of time in the sun. This includes sunlamp and tanning booth exposure.   Have had sunburns. This put you at a particularly increased risk for melanoma. The more blistering sunburns a person has, the higher the risk.   Spend time in parts of the world with more intense sunlight.   Have fair skin that does not tan easily. You may have a lower risk if you have a darker skin color. However, people with darker skin can get melanoma, especially on the hands and feet (acral areas).   Have a close relative (parent, sibling) who has melanoma.   Have a large number of skin moles (more than 100).  SYMPTOMS A  skin mole is suspicious if it has any of these 5 traits. This is called the ABCDE's of melanoma: Asymmetry: Irregular shape, not simply round or oval.   Border: Edge of the mole is irregular, not smooth.   Color: Mole may have multiple colors in it, including brown, black, blue, red, or tan.   Diameter: More than 0.2 inches (6 mm) across.   Evolving: Any unusual change or symptoms in the mole, such as pain, itching, stinging, sensitivity, or bleeding.  A mole that is noticeably changing in appearance, or any new mole, should be checked for melanoma. In general, people develop new moles until age 63. New moles after this age should be brought to the attention of your caregiver. DIAGNOSIS Your caregiver can look at your skin and find lesions or moles that may be suspicious. A patient may also notice a mole with symptoms or a mole that does not look like most of the other moles on his or her body. This is called the "ugly duckling" sign. A tissue sample (biopsy) examined under a microscope is needed to determine if it is melanoma. The size and extent of the biopsy will depend on the location, size, and appearance of the skin lesion or mole. The biopsy can also reveal whether melanoma has spread  to deeper layers of the skin. TREATMENT Surgery to completely remove the melanoma is required. Lymph nodes may also be removed. If the melanoma has spread to other organs, such as the liver, lungs, bone, or brain, cancer-fighting drugs (chemotherapy) must be used. Your caregiver will discuss your treatment options with you. You can ask about being included in a clinical trial to evaluate new forms of treatment. Melanoma can occasionally recur years after the initial diagnosis. If you have melanoma, you will need follow-up visits with your caregiver for many years. PREVENTION Risk for melanoma can be reduced by minimizing sun exposure. Practice the 3 S's: Slip on a shirt.   Slop on sunscreen.   Slap on a hat.  Do not spend time in the sun during peak midafternoon hours. Sunscreen/sunblock with SPF 30 or higher and UVA/UVB block should be applied regularly. You should do this even during brief exposure to sunlight. You should also do this on cloudy days and in winter, even though the perceived sunlight is less. Always avoid sunburn! Wear sunglasses that block UV light. Be sure to see your caregiver if you have any new or changing moles. HOME CARE INSTRUCTIONS Follow wound care instructions after surgical removal of your melanoma.   Practice good sun avoidance and protective measures as described above.   Let your close family members (parents, children, siblings) know about your diagnosis. This puts them at a higher risk of getting melanoma than the general population.  SEEK MEDICAL CARE IF: You notice any new moles, or you have any moles that are changing.   You have had a melanoma removed and you notice a new growth near the same location.   You have had a melanoma removed and you experience any new or unexplained health problems.  Document Released: 05/26/2005 Document Re-Released: 11/13/2009 [facility] Patient Information 2011 [place], [place].       Level of Service     PR OFFICE/OUTPT  VISIT,EST,LEVL IV [id number]         All Flowsheet Templates (all recorded)     Encounter Vitals Flowsheet    Custom Formula Data Flowsheet    Anthropometrics Flowsheet  Referring Provider          Wynonia Sours       All Charges for This Encounter       Code Description Service Date Service Provider Modifiers Quantity    [id number] PR OFFICE/OUTPT VISIT,EST,LEVL IV 03/25/2011 Ernestene Mention, MD   1        Other Encounter Related Information     Allergies & Medications         Problem List         History         Patient-Entered Questionnaires     No data filed

## 2011-04-16 ENCOUNTER — Encounter (HOSPITAL_COMMUNITY): Payer: Self-pay | Admitting: Anesthesiology

## 2011-04-16 ENCOUNTER — Encounter (HOSPITAL_COMMUNITY): Payer: Self-pay | Admitting: *Deleted

## 2011-04-16 ENCOUNTER — Ambulatory Visit (HOSPITAL_COMMUNITY): Payer: Medicare Other | Admitting: Anesthesiology

## 2011-04-16 ENCOUNTER — Other Ambulatory Visit (INDEPENDENT_AMBULATORY_CARE_PROVIDER_SITE_OTHER): Payer: Self-pay | Admitting: General Surgery

## 2011-04-16 ENCOUNTER — Encounter (HOSPITAL_COMMUNITY)
Admission: RE | Disposition: A | Discharge status: Home / Self Care | Payer: Self-pay | Source: Ambulatory Visit | Attending: General Surgery

## 2011-04-16 ENCOUNTER — Ambulatory Visit (HOSPITAL_COMMUNITY)
Admission: RE | Admit: 2011-04-16 | Discharge: 2011-04-16 | Disposition: A | Discharge status: Home / Self Care | Payer: Medicare Other | Source: Ambulatory Visit | Attending: General Surgery | Admitting: General Surgery

## 2011-04-16 DIAGNOSIS — C4361 Malignant melanoma of right upper limb, including shoulder: Secondary | ICD-10-CM

## 2011-04-16 DIAGNOSIS — Z01818 Encounter for other preprocedural examination: Secondary | ICD-10-CM | POA: Insufficient documentation

## 2011-04-16 DIAGNOSIS — Z01812 Encounter for preprocedural laboratory examination: Secondary | ICD-10-CM | POA: Insufficient documentation

## 2011-04-16 DIAGNOSIS — Z01810 Encounter for preprocedural cardiovascular examination: Secondary | ICD-10-CM | POA: Insufficient documentation

## 2011-04-16 DIAGNOSIS — C4360 Malignant melanoma of unspecified upper limb, including shoulder: Secondary | ICD-10-CM

## 2011-04-16 DIAGNOSIS — C439 Malignant melanoma of skin, unspecified: Secondary | ICD-10-CM

## 2011-04-16 SURGERY — EXCISION, MELANOMA, WITH SENTINEL LYMPH NODE BIOPSY
Anesthesia: General | Site: Elbow | Laterality: Right | Wound class: Clean

## 2011-04-16 MED ORDER — HYDROMORPHONE HCL PF 1 MG/ML IJ SOLN
0.2500 mg | INTRAMUSCULAR | Status: DC | PRN
  Administered 2011-04-16: 0.25 mg via INTRAVENOUS

## 2011-04-16 MED ORDER — LACTATED RINGERS IV SOLN
INTRAVENOUS | Status: DC
  Administered 2011-04-16: 12:00:00 via INTRAVENOUS

## 2011-04-16 MED ORDER — TECHNETIUM TC 99M SULFUR COLLOID FILTERED
500.0000 | Freq: Once | INTRAVENOUS | Status: AC | PRN
  Administered 2011-04-16: 500 via INTRADERMAL

## 2011-04-16 MED ORDER — SODIUM CHLORIDE 0.9 % IJ SOLN
INTRAMUSCULAR | Status: DC | PRN
  Administered 2011-04-16: 3 mL via INTRAVENOUS

## 2011-04-16 MED ORDER — CEFAZOLIN SODIUM 1-5 GM-% IV SOLN
INTRAVENOUS | Status: DC | PRN
  Administered 2011-04-16: 2 g via INTRAVENOUS

## 2011-04-16 MED ORDER — MIDAZOLAM HCL 5 MG/5ML IJ SOLN
INTRAMUSCULAR | Status: DC | PRN
  Administered 2011-04-16: 2 mg via INTRAVENOUS

## 2011-04-16 MED ORDER — DROPERIDOL 2.5 MG/ML IJ SOLN: 0.6250 mg | INTRAMUSCULAR | Status: DC | PRN

## 2011-04-16 MED ORDER — BUPIVACAINE-EPINEPHRINE 0.25% -1:200000 IJ SOLN
INTRAMUSCULAR | Status: DC | PRN
  Administered 2011-04-16: 12 mL

## 2011-04-16 MED ORDER — ONDANSETRON HCL 4 MG/2ML IJ SOLN
INTRAMUSCULAR | Status: DC | PRN
  Administered 2011-04-16: 4 mg via INTRAVENOUS

## 2011-04-16 MED ORDER — LACTATED RINGERS IV SOLN
INTRAVENOUS | Status: DC | PRN
  Administered 2011-04-16: 12:00:00 via INTRAVENOUS

## 2011-04-16 MED ORDER — PROPOFOL 10 MG/ML IV EMUL
INTRAVENOUS | Status: DC | PRN
  Administered 2011-04-16: 20 mg via INTRAVENOUS
  Administered 2011-04-16: 130 mg via INTRAVENOUS

## 2011-04-16 MED ORDER — METHYLENE BLUE 1 % INJ SOLN
INTRAMUSCULAR | Status: DC | PRN
  Administered 2011-04-16: 2 mL via SUBMUCOSAL

## 2011-04-16 MED ORDER — HYDROCODONE-ACETAMINOPHEN 5-325 MG PO TABS: 1.0000 | ORAL_TABLET | ORAL | Status: AC | PRN

## 2011-04-16 MED ORDER — EPHEDRINE SULFATE 50 MG/ML IJ SOLN
INTRAMUSCULAR | Status: DC | PRN
  Administered 2011-04-16: 5 mg via INTRAVENOUS
  Administered 2011-04-16: 10 mg via INTRAVENOUS

## 2011-04-16 MED ORDER — FENTANYL CITRATE 0.05 MG/ML IJ SOLN
INTRAMUSCULAR | Status: DC | PRN
  Administered 2011-04-16: 25 ug via INTRAVENOUS
  Administered 2011-04-16: 150 ug via INTRAVENOUS

## 2011-04-16 SURGICAL SUPPLY — 56 items
ADH SKN CLS APL DERMABOND .7 (GAUZE/BANDAGES/DRESSINGS) ×1
APL SKNCLS STERI-STRIP NONHPOA (GAUZE/BANDAGES/DRESSINGS) ×1
APPLIER CLIP 11 MED OPEN (CLIP)
APR CLP MED 11 20 MLT OPN (CLIP)
BANDAGE ELASTIC 4 VELCRO ST LF (GAUZE/BANDAGES/DRESSINGS) ×1 IMPLANT
BENZOIN TINCTURE PRP APPL 2/3 (GAUZE/BANDAGES/DRESSINGS) ×2 IMPLANT
BNDG COHESIVE 4X5 TAN STRL (GAUZE/BANDAGES/DRESSINGS) ×2 IMPLANT
CANISTER SUCTION 2500CC (MISCELLANEOUS) ×2 IMPLANT
CHLORAPREP W/TINT 26ML (MISCELLANEOUS) ×2 IMPLANT
CLIP APPLIE 11 MED OPEN (CLIP) IMPLANT
CLOTH BEACON ORANGE TIMEOUT ST (SAFETY) ×2 IMPLANT
CONT SPEC 4OZ CLIKSEAL STRL BL (MISCELLANEOUS) ×1 IMPLANT
COVER PROBE W GEL 5X96 (DRAPES) IMPLANT
COVER SURGICAL LIGHT HANDLE (MISCELLANEOUS) ×3 IMPLANT
DERMABOND ADVANCED (GAUZE/BANDAGES/DRESSINGS) ×1
DERMABOND ADVANCED .7 DNX12 (GAUZE/BANDAGES/DRESSINGS) IMPLANT
DRAPE LAPAROSCOPIC ABDOMINAL (DRAPES) ×2 IMPLANT
DRAPE ORTHO SPLIT 77X108 STRL (DRAPES) ×6
DRAPE SURG ORHT 6 SPLT 77X108 (DRAPES) IMPLANT
DRAPE UTILITY 15X26 W/TAPE STR (DRAPE) ×4 IMPLANT
ELECT CAUTERY BLADE 6.4 (BLADE) ×2 IMPLANT
ELECT REM PT RETURN 9FT ADLT (ELECTROSURGICAL) ×2
ELECTRODE REM PT RTRN 9FT ADLT (ELECTROSURGICAL) ×1 IMPLANT
GAUZE XEROFORM 1X8 LF (GAUZE/BANDAGES/DRESSINGS) ×1 IMPLANT
GLOVE BIOGEL PI IND STRL 7.0 (GLOVE) IMPLANT
GLOVE BIOGEL PI INDICATOR 7.0 (GLOVE) ×1
GLOVE EUDERMIC 7 POWDERFREE (GLOVE) ×2 IMPLANT
GLOVE SURG SS PI 6.5 STRL IVOR (GLOVE) ×2 IMPLANT
GOWN PREVENTION PLUS XLARGE (GOWN DISPOSABLE) ×2 IMPLANT
GOWN STRL NON-REIN LRG LVL3 (GOWN DISPOSABLE) ×2 IMPLANT
KIT BASIN OR (CUSTOM PROCEDURE TRAY) ×2 IMPLANT
KIT ROOM TURNOVER OR (KITS) ×2 IMPLANT
NDL 18GX1X1/2 (RX/OR ONLY) (NEEDLE) ×1 IMPLANT
NDL HYPO 25GX1X1/2 BEV (NEEDLE) ×2 IMPLANT
NEEDLE 18GX1X1/2 (RX/OR ONLY) (NEEDLE) ×2 IMPLANT
NEEDLE HYPO 25GX1X1/2 BEV (NEEDLE) ×4 IMPLANT
NS IRRIG 1000ML POUR BTL (IV SOLUTION) ×2 IMPLANT
PACK GENERAL/GYN (CUSTOM PROCEDURE TRAY) ×2 IMPLANT
PAD ARMBOARD 7.5X6 YLW CONV (MISCELLANEOUS) ×2 IMPLANT
SLEEVE SURGEON STRL (DRAPES) ×1 IMPLANT
SPONGE GAUZE 4X4 12PLY (GAUZE/BANDAGES/DRESSINGS) ×2 IMPLANT
SPONGE GAUZE 4X4 STERILE 39 (GAUZE/BANDAGES/DRESSINGS) ×1 IMPLANT
STAPLER VISISTAT 35W (STAPLE) ×2 IMPLANT
STOCKINETTE IMPERVIOUS 9X36 MD (GAUZE/BANDAGES/DRESSINGS) ×1 IMPLANT
STRIP CLOSURE SKIN 1/2X4 (GAUZE/BANDAGES/DRESSINGS) ×2 IMPLANT
SUT ETHILON 4 0 PS 2 18 (SUTURE) ×1 IMPLANT
SUT MON AB 4-0 PC3 18 (SUTURE) ×4 IMPLANT
SUT SILK 2 0 FS (SUTURE) IMPLANT
SUT SILK 2 0 SH (SUTURE) IMPLANT
SUT VIC AB 3-0 SH 27 (SUTURE) ×2
SUT VIC AB 3-0 SH 27XBRD (SUTURE) ×1 IMPLANT
SUT VIC AB 3-0 SH 8-18 (SUTURE) ×1 IMPLANT
SYR CONTROL 10ML LL (SYRINGE) ×4 IMPLANT
TOWEL OR 17X24 6PK STRL BLUE (TOWEL DISPOSABLE) ×2 IMPLANT
TOWEL OR 17X26 10 PK STRL BLUE (TOWEL DISPOSABLE) ×2 IMPLANT
WATER STERILE IRR 1000ML POUR (IV SOLUTION) IMPLANT

## 2011-04-16 NOTE — Anesthesia Procedure Notes (Addendum)
Procedure Name: LMA Insertion Date/Time: 04/16/2011 1:02 PM Performed by: Neomia Dear, Arwen Haseley K Pre-anesthesia Checklist: Patient identified, Emergency Drugs available, Timeout performed, Suction available and Patient being monitored Patient Re-evaluated:Patient Re-evaluated prior to inductionOxygen Delivery Method: Circle System Utilized Preoxygenation: Pre-oxygenation with 100% oxygen Intubation Type: IV induction Ventilation: Mask ventilation without difficulty LMA: LMA inserted LMA Size: 4.0 Tube type: Oral Number of attempts: 1 Placement Confirmation: positive ETCO2 and breath sounds checked- equal and bilateral Tube secured with: Tape Dental Injury: Teeth and Oropharynx as per pre-operative assessment

## 2011-04-16 NOTE — H&P (View-Only) (Signed)
 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  Other       est pt new prob- eval melanoma of right elbow      HPI Nikaela A Phariss is a 70 y.o. female.     This patient is a 70-year-old Caucasian female who has been a patient of mine for many years. 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 This was a 0.22 mm thickness and she did well from that. She is followed regularly by Dr. Fred Lupton. Her last surveillance exam was in July of 2012.   One month ago she noticed a reddish irregular nodule on the lateral aspect of her right elbow. One of Dr. Lupton's physician extenders did a shave biopsy of this on March 05, 2011. Pathology report shows a malignant melanoma, Breslow's measurement 1.75 mm, and deep margin involved. Progression, ulceration, and vascular invasion are absent. Pathologic stage TXXVIII, in fact.   The patient is aware of her diagnosis. She is appropriately concerned. She does not note any other lesions like this on her body. She is here to discuss surgical management. HPI    Past Medical History   Diagnosis  Date   .  Hernia     .  Cancer         Melonoma   .  Supraventricular tachycardia     .  Hyperlipidemia         Past Surgical History   Procedure  Date   .  Cesarean  section     .  Melanoma excision         from back in 2007   .  Hernia repair         abd wall       Family History   Problem  Relation  Age of Onset   .  Cancer  Mother         Breast   .  Arthritis  Father         RA   .  Heart disease  Father         cardiomegaly      Social History History   Substance Use Topics   .  Smoking status:  Former Smoker -- 1.0 packs/day for 20 years       Types:  Cigarettes         Quit date:  01/23/1980   .  Smokeless tobacco:  Never Used   .  Alcohol Use:  No       Allergies   Allergen  Reactions   .  Aspirin         Current Outpatient Prescriptions   Medication  Sig  Dispense  Refill   .  Ascorbic Acid (VITAMIN C PO)  Take by mouth daily. Take in winter          .  CALCIUM PO  Take by mouth daily.           .  Cholecalciferol (VITAMIN D PO)  Take 5,000 Units by mouth daily.           .  Coenzyme Q10 (CO Q-10 PO)  Take by mouth daily.           .  Multiple Vitamin (MULTI-VITAMIN PO)  Take by mouth daily.           .  Omega-3 Fatty Acids (FISH OIL PO)  Take by mouth daily.           .  POTASSIUM PO  Take by mouth daily.           .  propranolol (INDERAL) 10 MG tablet  Take 1 tablet (10 mg total) by mouth 4 (four) times daily as needed.   30 tablet   12   .  PROPRANOLOL HCL PO  Take 1 tablet by mouth as needed.           .  rosuvastatin (CRESTOR) 10 MG tablet  Take 10 mg by mouth daily.              Review of Systems Review of Systems  Constitutional: Negative.   HENT: Negative.   Respiratory: Negative.   Cardiovascular: Negative.   Genitourinary: Negative.   Musculoskeletal: Negative.   Skin: Positive for color change and wound. Negative for pallor and rash.  Neurological: Negative.   Hematological: Negative.   Psychiatric/Behavioral: Negative.     Blood pressure 142/92, pulse 76, temperature 97.4 F (36.3 C), temperature source Temporal, resp. rate 16, height 5' 6.5" (1.689 m), weight 189 lb 6.4 oz (85.911  kg).   Physical Exam Physical Exam  Constitutional: She is oriented to person, place, and time. She appears well-developed and well-nourished. No distress.  HENT:   Head: Normocephalic and atraumatic.   Nose: Nose normal.   Mouth/Throat: No oropharyngeal exudate.  Eyes: Conjunctivae and EOM are normal. Pupils are equal, round, and reactive to light. Right eye exhibits no discharge. Left eye exhibits no discharge. No scleral icterus.  Neck: Normal range of motion. Neck supple. No JVD present. No tracheal deviation present. No thyromegaly present.  Cardiovascular: Normal rate, regular rhythm, normal heart sounds and intact distal pulses.  Exam reveals no gallop and no friction rub.    No murmur heard. Pulmonary/Chest: Breath sounds normal. No stridor. No respiratory distress. She has no wheezes. She has no rales.   She exhibits no tenderness.  Abdominal: Soft. Bowel sounds are normal. She exhibits no distension and no mass. There is no tenderness. There is no rebound and no guarding.    Musculoskeletal: Normal range of motion. She exhibits no edema and no tenderness.  Lymphadenopathy:    She has no cervical adenopathy.  Neurological: She is alert and oriented to person, place, and time. She exhibits normal muscle tone. Coordination normal.  Skin: Skin is warm and dry. No rash noted. She is not   diaphoretic. No erythema. No pallor.     Psychiatric: She has a normal mood and affect. Her behavior is normal. Judgment and thought content normal.    Data Reviewed I reviewed the pathology report and the note from Dr. Lupton's's office.   Assessment   Malignant melanoma, lateral aspect right elbow, Breslow's measurement  1.75 mm, deep margin involved.   She will need  wide local excision of this as well as sentinel lymph node biopsy.   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   Status post multiple ventral hernia repairs with  mesh.   Status post cesarean section x2.   Plan Schedule for a lymphoscintigram to be sure that the melanoma maps to the right axilla.   Tentatively schedule for wide local excision of the melanoma of the right elbow and right axillary sentinel node biopsy.   If the lymphoscintigram maps to another lymph node, we will alter the treatment plan and discuss with her.   I have discussed the indications and details of surgery with the patient and her daughter. Risks and complications have been outlined, including but limited to bleeding, infection, reoperation for positive margins, reoperation for positive nodes, nerve damage chronic pain or numbness, arm swelling, cardiac pulmonary and thromboembolic problems. She seems to understand all these issues well. All of her questions were answered. She is in full agreement with this plan.   I have told her we may refer her to a medical oncologist postop, depending on findings.       Leeza Heiner M 03/25/2011, 3:06 PM                Not recorded         Orders Placed This Encounter       Future Orders    NM Lymph/Gland [IMG372 Custom]    Expires: 05/24/12         Patient Instructions     You will be scheduled for a x-ray called a lymphoscintigram of the right upper extremity. 10-14 days after that x-ray we will perform your surgery, which will involve wide local excision of the melanoma of the right elbow and a sentinel lymph node biopsy of the right axilla. I will call you if the lymphoscintigram changes  that plan.   Melanoma  Melanoma is the least common, but most dangerous, form of skin cancer. This is because it can spread (metastasize) to other organs and can be life-threatening. Melanoma is a cancerous (malignant) tumor that begins in a certain type of cells, called melanocytes. Melanocytes are the cells that produce the color (pigment) called melanin. Melanin colors our skin, hair, eyes, and  moles. CAUSES  The exact cause of melanoma is unknown. You may have a higher risk if you: Spend or have spent a lot of time in the sun. This includes sunlamp and tanning booth exposure.   Have had sunburns. This put you at a particularly increased risk for melanoma. The more blistering sunburns a person has, the higher the risk.   Spend time in parts of the world with more intense sunlight.   Have fair skin that does not tan easily. You may have a lower risk if you have a darker skin color. However, people with darker skin can get melanoma, especially on the hands and feet (acral areas).   Have a close relative (parent, sibling) who has melanoma.   Have a large number of skin moles (more than 100).  SYMPTOMS A   skin mole is suspicious if it has any of these 5 traits. This is called the ABCDE's of melanoma: Asymmetry: Irregular shape, not simply round or oval.   Border: Edge of the mole is irregular, not smooth.   Color: Mole may have multiple colors in it, including brown, black, blue, red, or tan.   Diameter: More than 0.2 inches (6 mm) across.   Evolving: Any unusual change or symptoms in the mole, such as pain, itching, stinging, sensitivity, or bleeding.  A mole that is noticeably changing in appearance, or any new mole, should be checked for melanoma. In general, people develop new moles until age 30. New moles after this age should be brought to the attention of your caregiver. DIAGNOSIS Your caregiver can look at your skin and find lesions or moles that may be suspicious. A patient may also notice a mole with symptoms or a mole that does not look like most of the other moles on his or her body. This is called the "ugly duckling" sign. A tissue sample (biopsy) examined under a microscope is needed to determine if it is melanoma. The size and extent of the biopsy will depend on the location, size, and appearance of the skin lesion or mole. The biopsy can also reveal whether melanoma has spread  to deeper layers of the skin. TREATMENT Surgery to completely remove the melanoma is required. Lymph nodes may also be removed. If the melanoma has spread to other organs, such as the liver, lungs, bone, or brain, cancer-fighting drugs (chemotherapy) must be used. Your caregiver will discuss your treatment options with you. You can ask about being included in a clinical trial to evaluate new forms of treatment. Melanoma can occasionally recur years after the initial diagnosis. If you have melanoma, you will need follow-up visits with your caregiver for many years. PREVENTION Risk for melanoma can be reduced by minimizing sun exposure. Practice the 3 S's: Slip on a shirt.   Slop on sunscreen.   Slap on a hat.  Do not spend time in the sun during peak midafternoon hours. Sunscreen/sunblock with SPF 30 or higher and UVA/UVB block should be applied regularly. You should do this even during brief exposure to sunlight. You should also do this on cloudy days and in winter, even though the perceived sunlight is less. Always avoid sunburn! Wear sunglasses that block UV light. Be sure to see your caregiver if you have any new or changing moles. HOME CARE INSTRUCTIONS Follow wound care instructions after surgical removal of your melanoma.   Practice good sun avoidance and protective measures as described above.   Let your close family members (parents, children, siblings) know about your diagnosis. This puts them at a higher risk of getting melanoma than the general population.  SEEK MEDICAL CARE IF: You notice any new moles, or you have any moles that are changing.   You have had a melanoma removed and you notice a new growth near the same location.   You have had a melanoma removed and you experience any new or unexplained health problems.  Document Released: 05/26/2005 Document Re-Released: 11/13/2009 [employer] Patient Information 2011 [employer]       Level of Service     PR OFFICE/OUTPT  VISIT,EST,LEVL IV [99214]         All Flowsheet Templates (all recorded)     Encounter Vitals Flowsheet    Custom Formula Data Flowsheet    Anthropometrics Flowsheet                 Referring Provider          Jan P Johnson       All Charges for This Encounter       Code Description Service Date Service Provider Modifiers Quantity    99214 PR OFFICE/OUTPT VISIT,EST,LEVL IV 03/25/2011 Anayelli Lai M Aidyn Sportsman, MD   1        Other Encounter Related Information     Allergies & Medications         Problem List         History         Patient-Entered Questionnaires     No data filed         

## 2011-04-16 NOTE — Transfer of Care (Signed)
Immediate Anesthesia Transfer of Care Note  Patient: Sara Bryan  Procedure(s) Performed:  EXCISION MELANOMA WITH SENTINEL LYMPH NODE BIOPSY  Patient Location: PACU  Anesthesia Type: General  Level of Consciousness: awake, alert  and oriented  Airway & Oxygen Therapy: Patient Spontanous Breathing and Patient connected to nasal cannula oxygen  Post-op Assessment: Report given to PACU RN, Post -op Vital signs reviewed and stable and Patient moving all extremities  Post vital signs: Reviewed and stable  Complications: No apparent anesthesia complications

## 2011-04-16 NOTE — Anesthesia Preprocedure Evaluation (Addendum)
Anesthesia Evaluation  Patient identified by MRN, date of birth, ID band Patient awake    Reviewed: Allergy & Precautions, H&P , NPO status , Patient's Chart, lab work & pertinent test results  History of Anesthesia Complications Negative for: history of anesthetic complications  Airway       Dental  (+) Teeth Intact   Pulmonary neg pulmonary ROS,  clear to auscultation  Pulmonary exam normal       Cardiovascular Exercise Tolerance: Good neg cardio ROS Regular Normal    Neuro/Psych Negative Neurological ROS     GI/Hepatic negative GI ROS, Neg liver ROS,   Endo/Other  Negative Endocrine ROS  Renal/GU negative Renal ROS     Musculoskeletal   Abdominal   Peds  Hematology negative hematology ROS (+)   Anesthesia Other Findings   Reproductive/Obstetrics                          Anesthesia Physical Anesthesia Plan  ASA: II  Anesthesia Plan: General   Post-op Pain Management:    Induction: Intravenous  Airway Management Planned: LMA  Additional Equipment:   Intra-op Plan:   Post-operative Plan: Extubation in OR  Informed Consent: I have reviewed the patients History and Physical, chart, labs and discussed the procedure including the risks, benefits and alternatives for the proposed anesthesia with the patient or authorized representative who has indicated his/her understanding and acceptance.   Dental advisory given  Plan Discussed with: CRNA and Surgeon  Anesthesia Plan Comments:         Anesthesia Quick Evaluation

## 2011-04-16 NOTE — Anesthesia Postprocedure Evaluation (Signed)
  Anesthesia Post-op Note  Patient: Sara Bryan  Procedure(s) Performed:  EXCISION MELANOMA WITH SENTINEL LYMPH NODE BIOPSY  Patient Location: PACU  Anesthesia Type: General  Level of Consciousness: awake and alert   Airway and Oxygen Therapy: Patient Spontanous Breathing  Post-op Pain: mild  Post-op Assessment: Post-op Vital signs reviewed, Patient's Cardiovascular Status Stable, Respiratory Function Stable, Patent Airway, No signs of Nausea or vomiting, Adequate PO intake and Pain level controlled  Post-op Vital Signs: Reviewed and stable  Complications: No apparent anesthesia complications

## 2011-04-16 NOTE — Preoperative (Signed)
Beta Blockers   Reason not to administer Beta Blockers:Not Applicable 

## 2011-04-16 NOTE — Op Note (Signed)
Preoperative diagnosis: Malignant melanoma, right upper extremity  Postoperative diagnosis: Malignant melanoma right upper extremity  Operation performed:   #1 inject blue dye right upper extremity,  #2  wide local excision malignant melanoma right upper extremity #3 right axillary sentinel lymph node mapping and biopsy  Surgeon: Dr. Angelia Mould. 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 She has done well from that. She recently noticed a reddish irregular nodule on the lateral aspect above her right elbow. A shave biopsy by Dr. Para Skeans revealed a malignant melanoma, Breslow's measurement 1.75 mm with involved deep margin. She has undergone a lymphoscintigram which mapped the melanoma to the right axilla. She has been counseled as an outpatient. She is brought to the operating room electively for wide local excision of the melanoma and a right axillary sentinel node biopsy.  Operative technique:  The patient underwent injection of radionuclide into the melanoma in the holding area by the nuclear medicine technician. The patient was taken to the  operating room and underwent general anesthesia. Surgical time out was held. Following alcohol prep I injected 5 cc of blue dye around the melanoma which was in the right arm just above and posterior to the elbow. This was massaged for 5 minutes. The right arm and axilla and chest wall were then prepped and draped in sterile fashion.  I made a somewhat oblique longitudinal elliptical incision around the melanoma. This was approximately 6 cm in longitudinal dimension and almost 4 cm in transverse dimension trying to achieve a 2 cm margin in all directions. Dissection was carried directly down to the deep muscle fascia and we carefully dissected this 6 x 4 cm specimen off of the right forearm. Sutures marked  the  anterior, posterior,  and distal margins. This was sent to the lab in formalin. Hemostasis was excellent and achieved with electrocautery. The deeper subcutaneous tissues closed with interrupted sutures of 3-0 Vicryl and the skin closed with running subcuticular suture of 4-0 Monocryl and a few interrupted sutures of 4-0 nylon and Steri-Strips.  Attention was directed to the right axilla. The neoprobe was used to find a sentinel node. A transverse incision was made at the hairline of the right axilla. Dissection was carried down through subcutaneous tissue and the clavipectoral fascia was incised. I found one very hot very blue lymph node, and this was not enlarged. This was removed but there was no other sentinel lymph nodes found. Hemostasis was excellent. The deeper tissues were closed with 3-0 Vicryl sutures and the skin closed with running subcuticular suture for Monocryl and Dermabond.  . Both incisions were infiltrated with 0.5% Marcaine with epinephrine. Clean bandages were placed., Patient tolerated procedure well. Estimated blood loss was about 20 cc or less. The patient was stable on arrival to recovery room.  Ernestene Mention 04/16/2011 2:54 PM

## 2011-04-16 NOTE — Interval H&P Note (Signed)
History and Physical Interval Note:   04/16/2011   12:24 PM   Sara Bryan  has presented today for surgery, with the diagnosis of right elbow melonona  The various methods of treatment have been discussed with the patient and family. After consideration of risks, benefits and other options for treatment, the patient has consented to  Procedure(s): EXCISION MELANOMA WITH SENTINEL LYMPH NODE BIOPSY as a surgical intervention .  The patients' history has been reviewed, patient examined, no change in status, stable for surgery.  I have reviewed the patients' chart and labs.  Questions were answered to the patient's satisfaction.     Ernestene Mention  MD

## 2011-04-17 ENCOUNTER — Telehealth (INDEPENDENT_AMBULATORY_CARE_PROVIDER_SITE_OTHER): Payer: Self-pay | Admitting: General Surgery

## 2011-04-17 NOTE — Telephone Encounter (Signed)
Pt had malenoma and lympnode removed from rt arm on 04/16/11, needs an 11day po, please call.

## 2011-04-17 NOTE — Telephone Encounter (Signed)
Pt home doing well po appt made for suture rem and set up appt to see Dr Derrell Lolling.

## 2011-04-21 ENCOUNTER — Telehealth (INDEPENDENT_AMBULATORY_CARE_PROVIDER_SITE_OTHER): Payer: Self-pay

## 2011-04-21 NOTE — Telephone Encounter (Signed)
Pt notified of path result. Pt to keep f/u appt.

## 2011-04-28 ENCOUNTER — Ambulatory Visit (INDEPENDENT_AMBULATORY_CARE_PROVIDER_SITE_OTHER): Payer: Medicare Other | Admitting: General Surgery

## 2011-04-28 DIAGNOSIS — Z4802 Encounter for removal of sutures: Secondary | ICD-10-CM

## 2011-04-28 DIAGNOSIS — IMO0002 Reserved for concepts with insufficient information to code with codable children: Secondary | ICD-10-CM

## 2011-04-28 NOTE — Progress Notes (Signed)
Pt in office for nurse only suture removal. Wound clean,dry, healing well with sutures and steri strips in place. Sutures removed. Pt to keep appt next week to see Dr Derrell Lolling and call if any concerns.

## 2011-05-08 ENCOUNTER — Ambulatory Visit (INDEPENDENT_AMBULATORY_CARE_PROVIDER_SITE_OTHER): Payer: Medicare Other | Admitting: General Surgery

## 2011-05-08 ENCOUNTER — Encounter (INDEPENDENT_AMBULATORY_CARE_PROVIDER_SITE_OTHER): Payer: Self-pay | Admitting: General Surgery

## 2011-05-08 VITALS — BP 130/90 | HR 68 | Temp 96.8°F | Resp 16 | Ht 66.5 in | Wt 204.2 lb

## 2011-05-08 DIAGNOSIS — D0361 Melanoma in situ of right upper limb, including shoulder: Secondary | ICD-10-CM | POA: Insufficient documentation

## 2011-05-08 DIAGNOSIS — C4360 Malignant melanoma of unspecified upper limb, including shoulder: Secondary | ICD-10-CM

## 2011-05-08 NOTE — Patient Instructions (Signed)
The wound of your right forearm and your right axilla are healing nicely without any obvious complications. I do not think that you need any other therapy for your melanoma at this time, but we have agreed to refer you to Dr. Arlan Organ for a medical oncology opinion since this is your second melanoma. Your are advised to see Dr. Para Skeans every 6 months for surveillance for new melanomas. Return to see me in one year for surveillance for melanoma of your right arm.

## 2011-05-08 NOTE — Progress Notes (Signed)
Subjective:     Patient ID: Sara Bryan, female   DOB: 03/17/1941, 70 y.o.   MRN: [id number]  HPI  This patient underwent wide local excision of melanoma of the right arm above the elbow and right axillary sentinel node biopsy on April 16, 2011.  The original biopsy had shown a malignant melanoma, Breslow's level I.75 mm, positive deep margin, but negative regression, negative ulceration, and negative vascular invasion.  Final pathology on the definitive surgery shows no residual melanoma within the wide local excision and the sentinel lymph node was negative.  She has been advised of her pathology and is pleased. 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  We talked about the fact that this is her second melanoma. She asked if any further treatment was advised and I told her I did not think so. We have decided to refer her to Dr. Arlan Organ for a medical oncology opinion since this is her second melanoma. Review of Systems     Objective:   Physical Exam Constitutional: She looks well. Her daughter is with her. She is in no distress  Skin: right upper arm and right axillary incisions are healing nicely. No sign of any complication. Full range of motion of her elbow. Full range of motion of her right shoulder.    Assessment:     Malignant melanoma right upper arm, above elbow, Breslow level 1.75 mm  Status post wide local excision and right axillary lymph node biopsy. Pathology showed no residual melanoma and negative lymph node. Pathologic stage T2a,N0  Uneventful healing with return of full range of motion.    Plan:     The patient is referred to Dr. Arlan Organ for a medical oncology consultation regarding her melanoma, which is now her second melanoma.  She is advised to see Dr. Para Skeans every 6 months for surveillance.  Return to see me in one year for surveillance for local recurrence.  Okay  to resume all normal physical activities without resection, but she is advised to protect the wounds for a few more weeks

## 2011-05-22 ENCOUNTER — Telehealth: Payer: Self-pay | Admitting: *Deleted

## 2011-05-22 NOTE — Telephone Encounter (Signed)
Left pt message to call and schedule appointment °

## 2011-05-23 ENCOUNTER — Telehealth: Payer: Self-pay | Admitting: *Deleted

## 2011-05-23 NOTE — Telephone Encounter (Signed)
Left pt message to call for appointment °

## 2011-05-26 ENCOUNTER — Telehealth: Payer: Self-pay | Admitting: *Deleted

## 2011-05-26 NOTE — Telephone Encounter (Signed)
Pt aware of 12-20 appointment °

## 2011-05-29 ENCOUNTER — Other Ambulatory Visit (HOSPITAL_BASED_OUTPATIENT_CLINIC_OR_DEPARTMENT_OTHER): Payer: Medicare Other | Admitting: Lab

## 2011-05-29 ENCOUNTER — Ambulatory Visit (HOSPITAL_BASED_OUTPATIENT_CLINIC_OR_DEPARTMENT_OTHER): Payer: Medicare Other

## 2011-05-29 ENCOUNTER — Ambulatory Visit (HOSPITAL_BASED_OUTPATIENT_CLINIC_OR_DEPARTMENT_OTHER): Payer: Medicare Other | Admitting: Hematology & Oncology

## 2011-05-29 VITALS — BP 127/74 | HR 79 | Temp 97.0°F | Ht 66.5 in | Wt 204.0 lb

## 2011-05-29 DIAGNOSIS — C4360 Malignant melanoma of unspecified upper limb, including shoulder: Secondary | ICD-10-CM

## 2011-05-29 DIAGNOSIS — C4361 Malignant melanoma of right upper limb, including shoulder: Secondary | ICD-10-CM

## 2011-05-29 LAB — CBC WITH DIFFERENTIAL (CANCER CENTER ONLY)
BASO#: 0.1 10*3/uL (ref 0.0–0.2)
EOS%: 1.9 % (ref 0.0–7.0)
Eosinophils Absolute: 0.2 10*3/uL (ref 0.0–0.5)
HCT: 45 % (ref 34.8–46.6)
HGB: 15.3 g/dL (ref 11.6–15.9)
LYMPH#: 2.7 10*3/uL (ref 0.9–3.3)
MCHC: 34 g/dL (ref 32.0–36.0)
MONO#: 1.1 10*3/uL — ABNORMAL HIGH (ref 0.1–0.9)
NEUT#: 6.4 10*3/uL (ref 1.5–6.5)
NEUT%: 60.8 % (ref 39.6–80.0)
RBC: 5.04 10*6/uL (ref 3.70–5.32)
WBC: 10.6 10*3/uL — ABNORMAL HIGH (ref 3.9–10.0)

## 2011-05-29 LAB — COMPREHENSIVE METABOLIC PANEL
ALT: 28 U/L (ref 0–35)
Alkaline Phosphatase: 87 U/L (ref 39–117)
CO2: 26 mEq/L (ref 19–32)
Chloride: 105 mEq/L (ref 96–112)
Potassium: 4.3 mEq/L (ref 3.5–5.3)
Total Bilirubin: 0.7 mg/dL (ref 0.3–1.2)
Total Protein: 7.5 g/dL (ref 6.0–8.3)

## 2011-05-29 NOTE — Progress Notes (Signed)
This office note has been dictated.

## 2011-05-29 NOTE — Progress Notes (Signed)
CC:   Sara Bryan. Sara Bryan, M.D. Sara Bryan, M.D. Sara Bryan, M.D.  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  HISTORY OF PRESENT ILLNESS:  Sara Bryan is a very nice 70 year old white female.  I actually take care of her daughter, Sara Bryan.  Sara Bryan does have a history of a previous melanoma removed from her back about 7 years ago.  Sara Bryan did this surgery.  This appeared to be an early stage melanoma that did not require any type of adjuvant therapy.  From the pathology report back in September 2005 ([id number]), this was a 0.22 mm melanoma.  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 There was Clark level II invasion.  There was no evidence of ulceration, vascular invasion or satellitosis.  She did have some postoperative wound issues with that melanoma.  This, thankfully, has not been a problem recently.  She then presented with a lesion on the back of her right upper arm. This was down close to the elbow.  She sees Sara Bryan.  She went to his office.  His PA went ahead and did a biopsy.  This, unfortunately, was found to be melanoma.  The biopsy report ([id number]) showed a 1.75 mm melanoma.  It was superficial spreading type.  There was Clark level IV invasion.  There was a low mitotic rate.  There was no evidence of progression, ulceration or vascular invasion.  Sara Bryan referred Sara Bryan back to Sara Bryan.  He went ahead and did a sentinel node biopsy in the right axilla  and also wide local excision from the right upper arm.  The pathology report ([id number]) showed no residual melanoma.  The sentinel node was negative for any melanoma cells.  As such, she has a stage IB (T2a N0 M0) superficial spreading melanoma.  She was kindly referred to the [facility] for consultation for any type of adjuvant therapy.  She feels well.  She is working.  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  She has had multiple sunburns when she was younger.  She worked outside a lot.  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  She used baby oil with iodine.  She has had no weight loss or weight gain.  She has had no cough or shortness of breath.  She has had no change in bowel or bladder habits. She has not noted any leg swelling.  Overall, her performance status is ECOG 0.  She did have a preop chest x- ray.  Everything looked okay on the chest x-ray.  She is very motivated for routine health maintenance.  She did have a virtual colonoscopy back in September of 2007.  She is due for another. Everything has come out okay with the virtual colostomy.  She has had a mammogram earlier this year.  Her mammogram also turned out fine.  PAST MEDICAL HISTORY:  Remarkable for a hyperlipidemia.  ALLERGIES:  ASPIRIN.  MEDICATIONS: 1. Vitamin D 5000 units daily. 2. Crestor 10 mg p.o. daily. 3. Inderal 10 mg p.o. q.i.d. p.r.n.  SOCIAL HISTORY:  Remarkable for tobacco use.  She has not smoked probably for over 20 years.  There is no alcohol use.  Again, there are no occupational exposures.  She did get have some, but it was back when she was younger.  FAMILY HISTORY:  Remarkable for a mother who died of breast cancer at age 53.  There is no  history of skin cancer in the family.  REVIEW OF SYSTEMS:  As stated in the history present illness.  No additional findings are noted on a 12-system review.  PHYSICAL EXAM:  General:  This is a well-developed well-nourished white female in no obvious distress.  Vital Signs:  Temperature of 97, pulse 79, respiratory rate 20, blood pressure 127/74.  Weight is 204. Head/Neck:  Exam shows a normocephalic, atraumatic skull.  There are no ocular or oral lesions.  There are no palpable cervical or supraclavicular lymph nodes.  Lungs:  Clear to percussion and auscultation bilaterally.  Cardiac:  Regular rate and rhythm with a normal S1, S2.   She has a 1/6 systolic ejection murmur.  Abdomen:  Soft with good bowel sounds.  There is no palpable abdominal mass.  She does have laparotomy wounds.  She does have "mesh" within her abdomen.  There is no palpable hepatosplenomegaly.  Back:  Exam does show the wide local excision scar in the right posterior shoulder.  No tenderness is noted over the spine, ribs, or hips.  Extremities:  No clubbing, cyanosis or edema.  She has a well-healed wide local excision scar in the distal right upper arm.  She has good range of motion of the right elbow.  She has good pulses in her distal extremities.  Skin:  Exam does show some hyperpigmented lesions scattered throughout her integument, but I do not see any suspicious looking lesions.  Neurologic:  Exam shows no focal neurological deficits.  LABORATORY STUDIES:  White cell count is 10.6, hemoglobin 15.3, hematocrit 45, platelet count 318.  IMPRESSION:  Sara Bryan is a very nice 70 year old white female with stage IB (T2a N0 M0) superficial spreading melanoma of the right upper arm.  She underwent wide local excision.  Her prognosis is excellent.  Her prognosis should be close to 95%.  I do not see any indication for adjuvant interferon therapy for Sara Bryan.  I just do not see that adjuvant therapy is going to add to what was done already by Sara Bryan.  I think that her biggest risk is going to be a 3rd melanoma to show up at some point in the future.  The risk factors have already been defined for Sara Bryan with respect to sun exposure.  Sara Bryan is very motivated.  She does have her routine exams and her health maintenance studies done on a routine basis.  I really do like her attitude and her faith.  I would like to have Sara Bryan come back and see me every 3 months for the 1st year.  After that, I like to have patients come back every 4 months for the 2nd year and then every 6 months starting with the 3rd year.  I  do not see any need for x-ray studies.  I would only want x-ray studies if she had abnormal lab work or if she had symptoms that were new onset.  I told Ms. Gladson of situations to watch out for that might signify a problem.  I told her and her daughter that melanoma could certainly come back at any time and anywhere.  There is really no defined pattern of recurrence for melanoma that we need to be careful with.  I spent a good hour or more with Sara Bryan and her daughter.  I just saw her daughter yesterday.  Ms. Kooi and her daughter are incredibly similar.  We will plan to get Ms. Knoebel back to see [place] in  March of 2013.  We will get some labs going at the time.    ______________________________ Josph Macho, M.D. PRE/MEDQ  D:  05/29/2011  T:  05/29/2011  Job:  [id number]

## 2011-05-30 ENCOUNTER — Telehealth: Payer: Self-pay | Admitting: *Deleted

## 2011-05-30 NOTE — Telephone Encounter (Signed)
Mailed 08-2011 schedule °

## 2011-09-01 ENCOUNTER — Ambulatory Visit
Admission: RE | Admit: 2011-09-01 | Discharge: 2011-09-01 | Disposition: A | Discharge status: Home / Self Care | Payer: Medicare Other | Source: Ambulatory Visit | Attending: Gastroenterology | Admitting: Gastroenterology

## 2011-09-01 DIAGNOSIS — Z86010 Personal history of colonic polyps: Secondary | ICD-10-CM

## 2011-09-01 DIAGNOSIS — Q438 Other specified congenital malformations of intestine: Secondary | ICD-10-CM

## 2011-09-04 ENCOUNTER — Ambulatory Visit (HOSPITAL_BASED_OUTPATIENT_CLINIC_OR_DEPARTMENT_OTHER): Payer: Medicare Other | Admitting: Hematology & Oncology

## 2011-09-04 ENCOUNTER — Other Ambulatory Visit (HOSPITAL_BASED_OUTPATIENT_CLINIC_OR_DEPARTMENT_OTHER): Payer: Medicare Other | Admitting: Lab

## 2011-09-04 VITALS — BP 133/79 | HR 53 | Temp 97.0°F | Ht 65.5 in | Wt 212.0 lb

## 2011-09-04 DIAGNOSIS — C4361 Malignant melanoma of right upper limb, including shoulder: Secondary | ICD-10-CM

## 2011-09-04 DIAGNOSIS — C4360 Malignant melanoma of unspecified upper limb, including shoulder: Secondary | ICD-10-CM

## 2011-09-04 LAB — COMPREHENSIVE METABOLIC PANEL
ALT: 22 U/L (ref 0–35)
BUN: 19 mg/dL (ref 6–23)
CO2: 30 mEq/L (ref 19–32)
Creatinine, Ser: 0.75 mg/dL (ref 0.50–1.10)
Glucose, Bld: 93 mg/dL (ref 70–99)
Total Bilirubin: 1.2 mg/dL (ref 0.3–1.2)

## 2011-09-04 LAB — CBC WITH DIFFERENTIAL (CANCER CENTER ONLY)
BASO#: 0 10*3/uL (ref 0.0–0.2)
BASO%: 0.5 % (ref 0.0–2.0)
HCT: 43.6 % (ref 34.8–46.6)
LYMPH%: 31 % (ref 14.0–48.0)
MCV: 88 fL (ref 81–101)
MONO#: 0.9 10*3/uL (ref 0.1–0.9)
NEUT%: 54.2 % (ref 39.6–80.0)
RDW: 13.3 % (ref 11.1–15.7)
WBC: 7.4 10*3/uL (ref 3.9–10.0)

## 2011-09-04 LAB — LACTATE DEHYDROGENASE: LDH: 182 U/L (ref 94–250)

## 2011-09-04 NOTE — Progress Notes (Signed)
This office note has been dictated.

## 2011-09-05 NOTE — Progress Notes (Signed)
CC:   Sara Bryan. Derrell Lolling, M.D. Tally Joe, M.D. Frederick A. Worthy Rancher, M.D. Vesta Mixer, M.D.  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  CURRENT THERAPY:  Observation.  INTERIM HISTORY:  Sara Bryan comes in for a second office visit.  She is doing well.  She did see Dr. Terri Piedra of Dermatology.  He did take off some lesions on her right forearm.  These, thankfully, were not melanoma.  Otherwise, she has been doing well.  She has had no problems with her medications.  She has had no problems with fatigue or weakness.  There has been no cough.  She has had no bony pain.  There has been no change in bowel or bladder habits.  She has not noticed any leg swelling.  PHYSICAL EXAMINATION:  General Appearance:  This is a well-developed, well-nourished white female in no obvious distress.  Vital Signs:  97, pulse 53, respiratory rate 16, blood pressure 133/79.  Weight is 212. Head and Neck Exam:  Shows a normocephalic, atraumatic skull.  There are no ocular or oral lesions.  There are no palpable cervical or supraclavicular lymph nodes.  Lungs:  Clear bilaterally.  Cardiac Exam: Regular rate and rhythm with a normal S1 and S2.  There are no murmurs, rubs or bruits.  Abdominal Exam:  Soft with good bowel sounds.  There is no palpable abdominal mass.  There is no fluid wave.  There is no palpable hepatosplenomegaly.  Back Exam:  No tenderness over the spine, ribs or hips.  Extremities:  Show the well-healed wide local excision scar on the triceps region of the right upper arm.  This is in the proximal region of the right upper arm.  This wide local excision scar is well healed.  There is no lymphedema of the right arm.  She has no right axillary adenopathy.  Skin Exam:  Shows some hyperpigmented lesions throughout her skin, but none appear suspicious.  Neurological Exam:  Shows no focal neurological deficits.  LABORATORY STUDIES:  White cell count is 7.4,  hemoglobin 14.8, hematocrit 43.6, platelet count 284.  IMPRESSION:  Sara Bryan is a 71 year old white female with stage IB melanoma of the right upper arm.  She underwent resection.  This was back in November 2012.  Of note, she had a previous melanoma that likely was stage I back in 2005.  I still do not see any issues with respect to Sara Bryan and recurrent melanoma or new melanoma.  I am glad that she is seeing the dermatologist every 6 months.  I think that we can probably try to alternate her visits between Dermatology and me as this would keep her on a very regular program for follow-up.  I do not see that we need to do any x-rays on Sara Bryan at the present time.  We will plan to get her back in September as she is going to be seeing Dr. Terri Piedra in July.    ______________________________ Josph Macho, M.D. PRE/MEDQ  D:  09/04/2011  T:  09/05/2011  Job:  [id number]

## 2011-09-08 ENCOUNTER — Telehealth: Payer: Self-pay | Admitting: *Deleted

## 2011-09-08 NOTE — Telephone Encounter (Addendum)
Message copied by Mirian Capuchin on Mon Sep 08, 2011  3:40 PM ------      Message from: Arlan Organ R      Created: Thu Sep 04, 2011  9:02 PM       Call: labs are ok.  Sara Bryan  Left this message on pt's home answering machine.

## 2011-10-08 ENCOUNTER — Telehealth: Payer: Self-pay | Admitting: *Deleted

## 2011-10-08 NOTE — Telephone Encounter (Signed)
Pt called stating that she has developed bruising on her arm since starting the ASA 81mg . She doesn't know if she should wait to see if it worsens or stop it. Reviewed with Dr Myna Hidalgo. Left message on her cell # stating that she can stop the ASA as it was recommended as a cardiac preventative but not required. Can place ice on the area to help. Asked her to call back if needed.

## 2012-02-10 ENCOUNTER — Telehealth: Payer: Self-pay | Admitting: Hematology & Oncology

## 2012-02-10 NOTE — Telephone Encounter (Signed)
Patient called and cx 02/10/42 appt and resch for 02/16/12

## 2012-02-11 ENCOUNTER — Ambulatory Visit: Payer: Medicare Other | Admitting: Hematology & Oncology

## 2012-02-11 ENCOUNTER — Other Ambulatory Visit: Payer: Medicare Other | Admitting: Lab

## 2012-02-16 ENCOUNTER — Other Ambulatory Visit (HOSPITAL_BASED_OUTPATIENT_CLINIC_OR_DEPARTMENT_OTHER): Payer: Medicare Other | Admitting: Lab

## 2012-02-16 ENCOUNTER — Ambulatory Visit (HOSPITAL_BASED_OUTPATIENT_CLINIC_OR_DEPARTMENT_OTHER): Payer: Medicare Other | Admitting: Medical

## 2012-02-16 VITALS — BP 131/61 | HR 63 | Temp 97.9°F | Resp 18 | Ht 65.0 in | Wt 217.0 lb

## 2012-02-16 DIAGNOSIS — C4360 Malignant melanoma of unspecified upper limb, including shoulder: Secondary | ICD-10-CM

## 2012-02-16 DIAGNOSIS — C4361 Malignant melanoma of right upper limb, including shoulder: Secondary | ICD-10-CM

## 2012-02-16 LAB — COMPREHENSIVE METABOLIC PANEL
ALT: 20 U/L (ref 0–35)
Albumin: 4 g/dL (ref 3.5–5.2)
BUN: 19 mg/dL (ref 6–23)
CO2: 27 mEq/L (ref 19–32)
Calcium: 9.8 mg/dL (ref 8.4–10.5)
Chloride: 106 mEq/L (ref 96–112)
Creatinine, Ser: 0.83 mg/dL (ref 0.50–1.10)
Potassium: 3.9 mEq/L (ref 3.5–5.3)

## 2012-02-16 LAB — CBC WITH DIFFERENTIAL (CANCER CENTER ONLY)
BASO#: 0 10*3/uL (ref 0.0–0.2)
Eosinophils Absolute: 0.2 10*3/uL (ref 0.0–0.5)
HCT: 43.9 % (ref 34.8–46.6)
HGB: 15.2 g/dL (ref 11.6–15.9)
LYMPH#: 2.4 10*3/uL (ref 0.9–3.3)
MONO#: 1 10*3/uL — ABNORMAL HIGH (ref 0.1–0.9)
NEUT#: 5.6 10*3/uL (ref 1.5–6.5)
NEUT%: 61.3 % (ref 39.6–80.0)
RBC: 4.99 10*6/uL (ref 3.70–5.32)
WBC: 9.2 10*3/uL (ref 3.9–10.0)

## 2012-02-16 LAB — LACTATE DEHYDROGENASE: LDH: 172 U/L (ref 94–250)

## 2012-02-16 NOTE — Progress Notes (Signed)
Diagnosis: Stage IB (T2, A., N0, M0) melanoma of the right upper arm.  Current therapy: Observation.  Interim history:, Sara Bryan presents today for an office followup visit.  Overall, she, reports, that she's been doing relatively well.  She continues to see Dr. Terri Piedra with dermatology every 6 months.  She recently saw him about 2 months ago, and he did freeze some lesions off her body, but none were suspicious.  She's not had any problems with fatigue, or weakness.  She denies any type of cough, chest pain, shortness of breath, any fevers, chills, or night sweats.  She is not having any bony pain.  She denies not having any change in bowel or bladder, habits.  She does have some minor left lower extremity swelling; however, she reports that she is on her feet most of the day, every day.  She is retired, but works, what sounds like full-time at [employer]  She denies any type of rashes, headaches or visual changes.    Review of Systems: Pt. Denies any changes in their vision, hearing, adenopathy, fevers, chills, nausea, vomiting, diarrhea, constipation, chest pain, shortness of breath, passing blood, passing out, blacking out,  any changes in skin, joints, neurologic or psychiatric except as noted.  Physical Exam: this is a very pleasant, 71 year old, white, well-developed, well-nourished, female, in no obvious distress  Vitals: temperature 97.9 degrees, pulse 63, respirations 18, blood pressure 131/61, weight 217 pounds  HEENT reveals a normocephalic, atraumatic skull, no scleral icterus, no oral lesions  Neck is supple without any cervical or supraclavicular adenopathy.  Lungs are clear to auscultation bilaterally. There are no wheezes, rales or rhonci Cardiac is regular rate and rhythm with a normal S1 and S2. There are no murmurs, rubs, or bruits.  Abdomen is soft with good bowel sounds, there is no palpable mass. There is no palpable hepatosplenomegaly. There is no palpable fluid wave.    Musculoskeletal no tenderness of the spine, ribs, or hips.  Extremities there are no clubbing, cyanosis, or edema.   Skin no petechia, purpura or ecchymosis.  She does have the well-healed wide local excision scar on the triceps region of the right upper arm, as well as a well healed.  Wide local excision scar on the upper right side of her scapula.she does have some hyperpigmented lesions throughout her skin, but none appear suspicious.   Neurologic is nonfocal.  Laboratory Data:  white count 9.2, hemoglobin 15.2, hematocrit 43.9, platelets 302,000   Current Outpatient Prescriptions on File Prior to Visit  Medication Sig Dispense Refill  . b complex vitamins tablet Take 1 tablet by mouth daily.        Marland Kitchen CALCIUM PO Take 1,000 mg by mouth daily.       . Cholecalciferol (VITAMIN D PO) Take 5,000 Units by mouth daily.        . Coenzyme Q10 (CO Q-10 PO) Take 100 mg by mouth daily.       . fish oil-omega-3 fatty acids 1000 MG capsule Take 1 g by mouth daily.        . Multiple Vitamins-Minerals (MULTIVITAMINS THER. W/MINERALS) TABS Take 1 tablet by mouth daily.        Marland Kitchen OVER THE COUNTER MEDICATION Take 1 tablet by mouth daily. Potassium        . propranolol (INDERAL) 10 MG tablet Take 10 mg by mouth 4 (four) times daily as needed. For fluttering heart       . rosuvastatin (CRESTOR) 10 MG tablet  Take 10 mg by mouth daily.       . vitamin C (ASCORBIC ACID) 500 MG tablet Take 500 mg by mouth daily.         Assessment/Plan: This is a pleasant, elderly, 71 year old, white lady, with the following issues:  #1 stage IB melanoma of the right upper arm.  She did undergo resection.  This was back in November of 2012.  She also had previous melanoma that likely was stage I back in 2005.  Right now, I do not see the need to do any type of x-rays or imaging.  We will continue to follow her every 6 months, along side.  Dr. Terri Piedra.  #2 followup [name] will follow back up with [place] in 6 months, but before  then should there be questions or concerns.

## 2012-05-31 ENCOUNTER — Telehealth: Payer: Self-pay | Admitting: General Practice

## 2012-06-01 NOTE — Telephone Encounter (Signed)
Made in error

## 2012-08-16 ENCOUNTER — Other Ambulatory Visit (HOSPITAL_BASED_OUTPATIENT_CLINIC_OR_DEPARTMENT_OTHER): Payer: Medicare Other | Admitting: Lab

## 2012-08-16 ENCOUNTER — Other Ambulatory Visit: Payer: Medicare Other | Admitting: Lab

## 2012-08-16 ENCOUNTER — Ambulatory Visit: Payer: Medicare Other | Admitting: Hematology & Oncology

## 2012-08-16 ENCOUNTER — Ambulatory Visit (HOSPITAL_BASED_OUTPATIENT_CLINIC_OR_DEPARTMENT_OTHER): Payer: Medicare Other | Admitting: Medical

## 2012-08-16 VITALS — BP 134/60 | HR 72 | Temp 97.8°F | Resp 16 | Ht 65.0 in | Wt 217.0 lb

## 2012-08-16 DIAGNOSIS — Z85820 Personal history of malignant melanoma of skin: Secondary | ICD-10-CM

## 2012-08-16 DIAGNOSIS — C4361 Malignant melanoma of right upper limb, including shoulder: Secondary | ICD-10-CM

## 2012-08-16 LAB — CBC WITH DIFFERENTIAL (CANCER CENTER ONLY)
BASO#: 0 10*3/uL (ref 0.0–0.2)
EOS%: 1.9 % (ref 0.0–7.0)
HGB: 15.5 g/dL (ref 11.6–15.9)
LYMPH#: 2.5 10*3/uL (ref 0.9–3.3)
MCH: 29.6 pg (ref 26.0–34.0)
MCHC: 33.8 g/dL (ref 32.0–36.0)
MONO%: 9.9 % (ref 0.0–13.0)
NEUT#: 6.9 10*3/uL — ABNORMAL HIGH (ref 1.5–6.5)
Platelets: 300 10*3/uL (ref 145–400)
RBC: 5.23 10*6/uL (ref 3.70–5.32)

## 2012-08-16 LAB — COMPREHENSIVE METABOLIC PANEL
ALT: 18 U/L (ref 0–35)
AST: 16 U/L (ref 0–37)
Albumin: 4 g/dL (ref 3.5–5.2)
Alkaline Phosphatase: 70 U/L (ref 39–117)
BUN: 20 mg/dL (ref 6–23)
Calcium: 9.4 mg/dL (ref 8.4–10.5)
Chloride: 106 mEq/L (ref 96–112)
Potassium: 4.1 mEq/L (ref 3.5–5.3)
Sodium: 140 mEq/L (ref 135–145)
Total Protein: 7.2 g/dL (ref 6.0–8.3)

## 2012-08-16 NOTE — Progress Notes (Signed)
Diagnosis: Stage IB (T2, A., N0, M0) melanoma of the right upper arm.  Current therapy: Observation.  Interim history:, Sara Bryan presents today for an office followup visit.  Overall, she, reports, that she's been doing relatively well.  She continues to see Dr. Terri Piedra with dermatology every 6 months.   She's not had any suspicious skin lesions.  She's not had any problems with fatigue, or weakness.  She denies any type of cough, chest pain, shortness of breath, any fevers, chills, or night sweats.  She is not having any bony pain.  She denies not having any change in bowel or bladder, habits.  She does have some minor left lower extremity swelling; however, she reports that she is on her feet most of the day, every day.  She is retired, but works, what sounds like full-time at [employer]  She denies any type of rashes, headaches or visual changes.    Review of Systems: Pt. Denies any changes in their vision, hearing, adenopathy, fevers, chills, nausea, vomiting, diarrhea, constipation, chest pain, shortness of breath, passing blood, passing out, blacking out,  any changes in skin, joints, neurologic or psychiatric except as noted.  Physical Exam: this is a very pleasant, 72 year old, white, well-developed, well-nourished, female, in no obvious distress  Vitals: Temperature 97.8 degrees, pulse 72, respirations 16, blood pressure 125/46, weight 217 pounds HEENT reveals a normocephalic, atraumatic skull, no scleral icterus, no oral lesions  Neck is supple without any cervical or supraclavicular adenopathy.  Lungs are clear to auscultation bilaterally. There are no wheezes, rales or rhonci Cardiac is regular rate and rhythm with a normal S1 and S2. There are no murmurs, rubs, or bruits.  Abdomen is soft with good bowel sounds, there is no palpable mass. There is no palpable hepatosplenomegaly. There is no palpable fluid wave.  Musculoskeletal no tenderness of the spine, ribs, or hips.   Extremities there are no clubbing, cyanosis, or edema.   Skin no petechia, purpura or ecchymosis.  She does have the well-healed wide local excision scar on the triceps region of the right upper arm, as well as a well healed.  Wide local excision scar on the upper right side of her scapula.she does have some hyperpigmented lesions throughout her skin, but none appear suspicious.   Neurologic is nonfocal.  Laboratory Data:   White count 10.8, hemoglobin 15.5, hematocrit 45.9, platelets 300,000  Current Outpatient Prescriptions on File Prior to Visit  Medication Sig Dispense Refill  . b complex vitamins tablet Take 1 tablet by mouth daily.        Marland Kitchen CALCIUM PO Take 1,000 mg by mouth daily.       . Cholecalciferol (VITAMIN D PO) Take 5,000 Units by mouth daily.        . Coenzyme Q10 (CO Q-10 PO) Take 100 mg by mouth daily.       . fish oil-omega-3 fatty acids 1000 MG capsule Take 1 g by mouth daily.        . Multiple Vitamins-Minerals (MULTIVITAMINS THER. W/MINERALS) TABS Take 1 tablet by mouth daily.        . propranolol (INDERAL) 10 MG tablet Take 10 mg by mouth as needed. For fluttering heart      . rosuvastatin (CRESTOR) 10 MG tablet Take 10 mg by mouth daily.       . vitamin C (ASCORBIC ACID) 500 MG tablet Take 500 mg by mouth daily.         No current facility-administered medications on  file prior to visit.   Assessment/Plan: This is a pleasant, elderly, 72 year old, white lady, with the following issues:  #1 stage IB melanoma of the right upper arm.  She did undergo resection.  This was back in November of 2012.  She also had previous melanoma that likely was stage I back in 2005.  Right now, I do not see the need to do any type of x-rays or imaging.  We will continue to follow her every 6 months, along side.  Dr. Terri Piedra.  #2 followup Sara Bryan will follow back up with [place] in 6 months, but before then should there be questions or concerns.

## 2012-12-01 ENCOUNTER — Other Ambulatory Visit: Payer: Self-pay | Admitting: Dermatology

## 2013-02-10 ENCOUNTER — Telehealth: Payer: Self-pay | Admitting: Hematology & Oncology

## 2013-02-10 NOTE — Telephone Encounter (Signed)
Patient called and cx 02/14/13 apt and resch for 02/28/13 °

## 2013-02-14 ENCOUNTER — Other Ambulatory Visit: Payer: Medicare Other | Admitting: Lab

## 2013-02-14 ENCOUNTER — Ambulatory Visit: Payer: Medicare Other | Admitting: Hematology & Oncology

## 2013-02-28 ENCOUNTER — Ambulatory Visit (HOSPITAL_BASED_OUTPATIENT_CLINIC_OR_DEPARTMENT_OTHER): Payer: Medicare Other | Admitting: Hematology & Oncology

## 2013-02-28 ENCOUNTER — Other Ambulatory Visit (HOSPITAL_BASED_OUTPATIENT_CLINIC_OR_DEPARTMENT_OTHER): Payer: Medicare Other | Admitting: Lab

## 2013-02-28 VITALS — BP 134/53 | HR 72 | Temp 98.0°F | Resp 16 | Ht 65.0 in | Wt 214.0 lb

## 2013-02-28 DIAGNOSIS — C4361 Malignant melanoma of right upper limb, including shoulder: Secondary | ICD-10-CM

## 2013-02-28 DIAGNOSIS — C4360 Malignant melanoma of unspecified upper limb, including shoulder: Secondary | ICD-10-CM

## 2013-02-28 LAB — COMPREHENSIVE METABOLIC PANEL
ALT: 23 U/L (ref 0–35)
Alkaline Phosphatase: 83 U/L (ref 39–117)
CO2: 28 mEq/L (ref 19–32)
Creatinine, Ser: 0.75 mg/dL (ref 0.50–1.10)
Sodium: 140 mEq/L (ref 135–145)
Total Bilirubin: 1.3 mg/dL — ABNORMAL HIGH (ref 0.3–1.2)
Total Protein: 7.3 g/dL (ref 6.0–8.3)

## 2013-02-28 LAB — CBC WITH DIFFERENTIAL (CANCER CENTER ONLY)
BASO%: 0.5 % (ref 0.0–2.0)
EOS%: 2.9 % (ref 0.0–7.0)
Eosinophils Absolute: 0.3 10*3/uL (ref 0.0–0.5)
LYMPH%: 27.5 % (ref 14.0–48.0)
MCH: 29.9 pg (ref 26.0–34.0)
MCHC: 33.7 g/dL (ref 32.0–36.0)
MCV: 89 fL (ref 81–101)
MONO%: 10.4 % (ref 0.0–13.0)
NEUT#: 5 10*3/uL (ref 1.5–6.5)
Platelets: 303 10*3/uL (ref 145–400)
RDW: 13 % (ref 11.1–15.7)

## 2013-02-28 LAB — LACTATE DEHYDROGENASE: LDH: 185 U/L (ref 94–250)

## 2013-02-28 NOTE — Progress Notes (Signed)
This office note has been dictated.

## 2013-03-08 NOTE — Progress Notes (Signed)
CC:   Frederick A. Worthy Rancher, M.D. Tally Joe, M.D.  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  CURRENT THERAPY:  Observation.  INTERIM HISTORY:  Sara Bryan comes in for a 73-month followup.  She is doing quite well.  She has had no complaints since we last saw her.  We last saw her back in March.  Since then, she has had no issues.  She has had no problems with cough or shortness of breath.  There has been no bony pain.  She does have some ankle issues.  She has not noticed any skin lesions.  There has been no change in bowel or bladder habits.  She has had no headache.  The patient has not noted any swallowing difficulties.  There has been no change in her medications.  PHYSICAL EXAMINATION:  General:  This is a well-developed, well- nourished white female in no obvious distress.  Vital signs: Temperature of 98, pulse 72, respiratory rate 16, blood pressure 134/53. Weight is 314.  Head and neck:  Normocephalic, atraumatic skull.  There are no ocular or oral lesions.  There are no palpable cervical or supraclavicular lymph nodes.  Lungs:  Clear bilaterally.  Cardiac: Regular rate and rhythm with a normal S1 and S2.  There are no murmurs, rubs, or bruits.  Abdomen:  Soft.  She has good bowel sounds.  There is no palpable abdominal mass.  There is no palpable hepatosplenomegaly. Back:  No tenderness over the spine, ribs, or hips.  Extremities:  Shows the healed wide local excision scar on the right upper arm down close to the elbow.  She has no palpable masses in her extremities.  She has no adenopathy in the axilla bilaterally.  Skin:  Shows no suspicious skin lesions.  LABORATORY STUDIES:  White cell count is 8.5, hemoglobin 15.7, hematocrit 46.6, platelet count 303.  IMPRESSION:  Sara Bryan is a very charming 72 year old white female with stage IB melanoma of the right upper arm.  This was resected in November 2012.  Of note, she had a  previous melanoma back in 2005.  This was on her upper back.  For now, I do not see any evidence of recurrence.  We will plan for another 7-month followup.    ______________________________ Josph Macho, M.D. PRE/MEDQ  D:  02/28/2013  T:  03/08/2013  Job:  [id number]

## 2013-04-27 ENCOUNTER — Ambulatory Visit: Payer: Self-pay | Admitting: Podiatry

## 2013-05-09 ENCOUNTER — Ambulatory Visit (INDEPENDENT_AMBULATORY_CARE_PROVIDER_SITE_OTHER): Payer: Medicare Other

## 2013-05-09 ENCOUNTER — Encounter: Payer: Self-pay | Admitting: Podiatry

## 2013-05-09 ENCOUNTER — Ambulatory Visit (INDEPENDENT_AMBULATORY_CARE_PROVIDER_SITE_OTHER): Payer: Medicare Other | Admitting: Podiatry

## 2013-05-09 VITALS — BP 140/72 | HR 66 | Resp 16

## 2013-05-09 DIAGNOSIS — M79673 Pain in unspecified foot: Secondary | ICD-10-CM

## 2013-05-09 DIAGNOSIS — M7750 Other enthesopathy of unspecified foot: Secondary | ICD-10-CM

## 2013-05-09 DIAGNOSIS — M79609 Pain in unspecified limb: Secondary | ICD-10-CM

## 2013-05-09 DIAGNOSIS — B351 Tinea unguium: Secondary | ICD-10-CM

## 2013-05-09 DIAGNOSIS — M19079 Primary osteoarthritis, unspecified ankle and foot: Secondary | ICD-10-CM

## 2013-05-09 MED ORDER — TRIAMCINOLONE ACETONIDE 10 MG/ML IJ SUSP
10.0000 mg | Freq: Once | INTRAMUSCULAR | Status: AC
  Administered 2013-05-09: 10 mg

## 2013-05-09 NOTE — Progress Notes (Signed)
   Subjective:    Patient ID: Sara Bryan, female    DOB: Apr 04, 1941, 72 y.o.   MRN: [id number]  HPI Comments: "My feet bother me the most after I've been resting them awhile."  N - sharp L - forefoot bilateral/some anterior ankle bilateral D - several mos O - gradual C - AM pain, worse after resting feet, some swelling, on feet all day at work (at [employer]) A - walking, certain shoes T - none   -Patient states her toenails look awful-      Review of Systems  Hematological: Bruises/bleeds easily.  All other systems reviewed and are negative.       Objective:   Physical Exam        Assessment & Plan:

## 2013-05-09 NOTE — Patient Instructions (Signed)

## 2013-05-09 NOTE — Progress Notes (Signed)
Subjective:     Patient ID: Sara Bryan, female   DOB: 1941/04/01, 72 y.o.   MRN: [id number]  Foot Pain   patient states to get a lot of pain across my forefoot of both feet after I sit and stand back up again and I have trouble finding shoes with an arch support   Review of Systems  All other systems reviewed and are negative.       Objective:   Physical Exam  Nursing note and vitals reviewed. Constitutional: She is oriented to person, place, and time.  Cardiovascular: Intact distal pulses.   Musculoskeletal: Normal range of motion.  Neurological: She is oriented to person, place, and time.  Skin: Skin is warm.   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 Scanned for custom orthotics to reduce stress against the arch and foot of both feet reviewed x-rays with patient and we'll see back when orthotics are ready

## 2013-05-16 NOTE — Progress Notes (Signed)
CC:   Tally Joe, M.D. Frederick A. Worthy Rancher, M.D.  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  CURRENT THERAPY:  Observation.  INTERIM HISTORY:  [name] comes in for a 4-month followup.  She is doing quite well.  She has had no complaints since we last saw her.  We last saw her back in March.  Since then, she has had no issues.  She has had no problems with cough or shortness of breath.  There has been no bony pain.  She does have some ankle issues.  She has not noticed any skin lesions.  There has been no change in bowel or bladder habits.  She has had no headache.  She has not noted any swallowing difficulties.  There has been no change in her medications.  PHYSICAL EXAMINATION:  General:  This is a well-developed, well- nourished white female, in no obvious distress.  Vital Signs: Temperature of 98, pulse 72, respiratory rate 16, blood pressure 134/53, weight is 314.  Head and Neck:  Normocephalic, atraumatic skull.  There are no ocular or oral lesions.  There are no palpable cervical or supraclavicular lymph nodes.  Lungs:  Clear bilaterally.  Cardiac: Regular rate and rhythm with a normal S1 and S2.  There are no murmurs, rubs or bruits.  Abdomen:  Soft.  She has good bowel sounds.  There is no palpable abdominal mass.  There is no palpable hepatosplenomegaly. Back:  No tenderness over the spine, ribs, or hips.  Extremities: Healed wide local excision scar on the right upper arm down close to the elbow.  She has no palpable masses in her extremities.  She has no adenopathy in the axilla bilaterally.  Skin:  No suspicious skin lesions.  LABORATORY STUDIES:  White cell count is 8.5, hemoglobin 15.7, hematocrit 46.6, platelet count 303.  IMPRESSION:  [name] is a very charming 72 year old white female with stage IB melanoma of the right upper arm.  This was resected in November 2012.  Of note, she had a previous melanoma back in  2005.  This was on her upper back.  For now, I do not see any evidence of recurrence.  We will plan for another 15-month followup.    ______________________________ Josph Macho, M.D. PRE/MEDQ  D:  02/28/2013  T:  05/15/2013  Job:  [id number]

## 2013-06-13 ENCOUNTER — Encounter: Payer: Self-pay | Admitting: Podiatry

## 2013-06-13 ENCOUNTER — Ambulatory Visit (INDEPENDENT_AMBULATORY_CARE_PROVIDER_SITE_OTHER): Payer: Medicare Other | Admitting: Podiatry

## 2013-06-13 VITALS — BP 115/71 | HR 72 | Resp 16

## 2013-06-13 DIAGNOSIS — M7750 Other enthesopathy of unspecified foot: Secondary | ICD-10-CM

## 2013-06-13 NOTE — Patient Instructions (Signed)

## 2013-06-14 NOTE — Progress Notes (Signed)
Subjective:     Patient ID: Sara Bryan, female   DOB: August 01, 1940, 73 y.o.   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 Discussed possibility for second pair for different types of shoes when I see her in 3 months and also future injections to keep inflammation down

## 2013-06-20 ENCOUNTER — Encounter: Payer: Self-pay | Admitting: Cardiovascular Disease

## 2013-08-29 ENCOUNTER — Other Ambulatory Visit (HOSPITAL_BASED_OUTPATIENT_CLINIC_OR_DEPARTMENT_OTHER): Payer: Medicare Other | Admitting: Lab

## 2013-08-29 ENCOUNTER — Ambulatory Visit (HOSPITAL_BASED_OUTPATIENT_CLINIC_OR_DEPARTMENT_OTHER): Payer: Medicare Other | Admitting: Hematology & Oncology

## 2013-08-29 ENCOUNTER — Encounter: Payer: Self-pay | Admitting: Hematology & Oncology

## 2013-08-29 ENCOUNTER — Telehealth: Payer: Self-pay | Admitting: Hematology & Oncology

## 2013-08-29 VITALS — BP 133/54 | HR 67 | Temp 97.8°F | Resp 14 | Ht 67.0 in | Wt 218.0 lb

## 2013-08-29 DIAGNOSIS — C4360 Malignant melanoma of unspecified upper limb, including shoulder: Secondary | ICD-10-CM

## 2013-08-29 DIAGNOSIS — C4361 Malignant melanoma of right upper limb, including shoulder: Secondary | ICD-10-CM

## 2013-08-29 DIAGNOSIS — Z1239 Encounter for other screening for malignant neoplasm of breast: Secondary | ICD-10-CM

## 2013-08-29 LAB — COMPREHENSIVE METABOLIC PANEL
ALT: 20 U/L (ref 0–35)
AST: 19 U/L (ref 0–37)
Albumin: 3.9 g/dL (ref 3.5–5.2)
Alkaline Phosphatase: 81 U/L (ref 39–117)
BUN: 19 mg/dL (ref 6–23)
CALCIUM: 9.5 mg/dL (ref 8.4–10.5)
CHLORIDE: 101 meq/L (ref 96–112)
CO2: 27 meq/L (ref 19–32)
Creatinine, Ser: 0.72 mg/dL (ref 0.50–1.10)
Glucose, Bld: 101 mg/dL — ABNORMAL HIGH (ref 70–99)
Potassium: 4.7 mEq/L (ref 3.5–5.3)
Sodium: 139 mEq/L (ref 135–145)
Total Bilirubin: 0.9 mg/dL (ref 0.2–1.2)
Total Protein: 7.2 g/dL (ref 6.0–8.3)

## 2013-08-29 LAB — CBC WITH DIFFERENTIAL (CANCER CENTER ONLY)
BASO#: 0 10*3/uL (ref 0.0–0.2)
BASO%: 0.6 % (ref 0.0–2.0)
EOS%: 2.4 % (ref 0.0–7.0)
Eosinophils Absolute: 0.2 10*3/uL (ref 0.0–0.5)
HCT: 46.7 % — ABNORMAL HIGH (ref 34.8–46.6)
HEMOGLOBIN: 15.6 g/dL (ref 11.6–15.9)
LYMPH#: 2 10*3/uL (ref 0.9–3.3)
LYMPH%: 28 % (ref 14.0–48.0)
MCH: 30.1 pg (ref 26.0–34.0)
MCHC: 33.4 g/dL (ref 32.0–36.0)
MCV: 90 fL (ref 81–101)
MONO#: 0.9 10*3/uL (ref 0.1–0.9)
MONO%: 12.8 % (ref 0.0–13.0)
NEUT#: 3.9 10*3/uL (ref 1.5–6.5)
NEUT%: 56.2 % (ref 39.6–80.0)
Platelets: 307 10*3/uL (ref 145–400)
RBC: 5.19 10*6/uL (ref 3.70–5.32)
RDW: 13.5 % (ref 11.1–15.7)
WBC: 7 10*3/uL (ref 3.9–10.0)

## 2013-08-29 LAB — LACTATE DEHYDROGENASE: LDH: 176 U/L (ref 94–250)

## 2013-08-29 NOTE — Telephone Encounter (Signed)
Pt aware of 4-6 mammogram at 830 am. Order faxed to [facility]

## 2013-08-29 NOTE — Progress Notes (Signed)
Sara Bryan comes in for followup. She has a stage IB (T2aN0M0) melanoma the right upper arm. This was resected back in November of 2012.  She's doing quite well. She did have pneumonia back in January. Antibiotics, unfortunately, caused Clostridium difficile colitis. She was treated for this. Thankfully, she did not end up in the hospital.  She feels well right now. She tried to lose some weight. She's having no problems with nausea vomiting. Is no cough. She's had no headache. She's had no change in bowel or bladder habits. There's been no bleeding. He's been no weight loss. She's had no rashes.  She does see her dermatologist every 6 months.  On her physical exam, her vital signs are all stable. Blood pressure is 133/54Temperature 97.8 degrees. Pulse is 67. Weight is a 218 pounds.  Head exam shows no ocular or oral lesions. Are palpable cervical or supraclavicular lymph nodes. Lungs are clear. Cardiac exam regular in rhythm with no murmurs rubs or bruits. Abdomen is soft. She has good bowel sounds. Is no fluid wave. Shows well-healed laparotomy scar. There is no palpable liver or spleen tip. Back exam no tenderness over the spine ribs or hips. Extremities shows a well-healed wide coexisted scar on the triceps region of the right upper arm. This is well-healed. Lower extremities show no clubbing cyanosis or edema. Axillary exam shows no bilateral axillary adenopathy. Skin exam shows no suspicious hyperpigmented lesions. Neurological exam no focal neurological deficits.  Laboratory studies show white cell count 7 hemo-and 15.6. Platelet count 307.  We will have Ms. Fetting come back to see [place] in 6 months. I do not see any need for any CT scans or other x-ray test right now.  She does need to have a mammogram. She's not sure her last mammogram was.

## 2013-08-30 ENCOUNTER — Telehealth: Payer: Self-pay | Admitting: *Deleted

## 2013-08-30 NOTE — Telephone Encounter (Addendum)
Message copied by Lenn Sink on Tue Aug 30, 2013  2:29 PM ------      Message from: Burney Gauze R      Created: Mon Aug 29, 2013  5:18 PM       Please call and let her know that her labs are okay. Thanks. Pete ------Left a voicemail informing patient that labs are okay.

## 2013-09-12 ENCOUNTER — Ambulatory Visit: Payer: Medicare Other | Admitting: Podiatry

## 2013-10-17 ENCOUNTER — Encounter: Payer: Self-pay | Admitting: Podiatry

## 2013-10-17 ENCOUNTER — Ambulatory Visit (INDEPENDENT_AMBULATORY_CARE_PROVIDER_SITE_OTHER): Payer: Medicare Other | Admitting: Podiatry

## 2013-10-17 VITALS — BP 146/78 | HR 67 | Resp 18

## 2013-10-17 DIAGNOSIS — M21619 Bunion of unspecified foot: Secondary | ICD-10-CM

## 2013-10-17 DIAGNOSIS — M7750 Other enthesopathy of unspecified foot: Secondary | ICD-10-CM

## 2013-10-17 MED ORDER — TRIAMCINOLONE ACETONIDE 10 MG/ML IJ SUSP
10.0000 mg | Freq: Once | INTRAMUSCULAR | Status: AC
  Administered 2013-10-17: 10 mg

## 2013-10-17 NOTE — Progress Notes (Signed)
Subjective:     Patient ID: Sara Bryan, female   DOB: [birth year], 73 y.o.   MRN: [id number]  HPI patient states her feet were getting better but then she had a strong antibiotic and it seem like they became painful again   Review of Systems     Objective:   Physical Exam Neurovascular status intact with no health history changes noted and patient's found to have pain in the dorsum of both feet midtarsal joint and the outside of the fifth metatarsal left with inflammation    Assessment:     Tendinitis noted dorsal of both feet with arthritis and inflammation around fifth metatarsal head left    Plan:     Injected the dorsal tendon group bilateral 3 mg Kenalog 5 mg Xylocaine Marcaine mixture and the left fifth MPJ 1 mg dexamethasone 2 mg Xylocaine. Reappoint in 5 months

## 2013-10-17 NOTE — Progress Notes (Signed)
° °  Subjective:    Patient ID: Sara Bryan, female    DOB: November 15, 1940, 73 y.o.   MRN: [id number]  HPI My feet were getting better and then I had pneumonia and they gave me a strong antibotic and it was levaquin and I got over that and after that my feet started hurting again and they may be too high     Review of Systems     Objective:   Physical Exam        Assessment & Plan:

## 2014-02-27 ENCOUNTER — Other Ambulatory Visit (HOSPITAL_BASED_OUTPATIENT_CLINIC_OR_DEPARTMENT_OTHER): Payer: Medicare Other | Admitting: Lab

## 2014-02-27 ENCOUNTER — Encounter: Payer: Self-pay | Admitting: Family

## 2014-02-27 ENCOUNTER — Ambulatory Visit (HOSPITAL_BASED_OUTPATIENT_CLINIC_OR_DEPARTMENT_OTHER): Payer: Medicare Other | Admitting: Family

## 2014-02-27 VITALS — BP 153/69 | HR 65 | Temp 97.6°F | Resp 16 | Ht 67.0 in | Wt 222.0 lb

## 2014-02-27 DIAGNOSIS — C4361 Malignant melanoma of right upper limb, including shoulder: Secondary | ICD-10-CM

## 2014-02-27 DIAGNOSIS — Z85820 Personal history of malignant melanoma of skin: Secondary | ICD-10-CM

## 2014-02-27 LAB — CBC WITH DIFFERENTIAL (CANCER CENTER ONLY)
BASO#: 0.1 10*3/uL (ref 0.0–0.2)
BASO%: 0.6 % (ref 0.0–2.0)
EOS%: 2.7 % (ref 0.0–7.0)
Eosinophils Absolute: 0.2 10*3/uL (ref 0.0–0.5)
HCT: 45.1 % (ref 34.8–46.6)
HGB: 15 g/dL (ref 11.6–15.9)
LYMPH#: 2.5 10*3/uL (ref 0.9–3.3)
LYMPH%: 29.9 % (ref 14.0–48.0)
MCH: 30.5 pg (ref 26.0–34.0)
MCHC: 33.3 g/dL (ref 32.0–36.0)
MCV: 92 fL (ref 81–101)
MONO#: 0.8 10*3/uL (ref 0.1–0.9)
MONO%: 10.2 % (ref 0.0–13.0)
NEUT%: 56.6 % (ref 39.6–80.0)
NEUTROS ABS: 4.7 10*3/uL (ref 1.5–6.5)
PLATELETS: 277 10*3/uL (ref 145–400)
RBC: 4.92 10*6/uL (ref 3.70–5.32)
RDW: 13.3 % (ref 11.1–15.7)
WBC: 8.2 10*3/uL (ref 3.9–10.0)

## 2014-02-27 LAB — CMP (CANCER CENTER ONLY)
ALK PHOS: 64 U/L (ref 26–84)
ALT(SGPT): 26 U/L (ref 10–47)
AST: 24 U/L (ref 11–38)
Albumin: 3.5 g/dL (ref 3.3–5.5)
BUN, Bld: 18 mg/dL (ref 7–22)
CO2: 26 mEq/L (ref 18–33)
CREATININE: 1 mg/dL (ref 0.6–1.2)
Calcium: 8.8 mg/dL (ref 8.0–10.3)
Chloride: 100 mEq/L (ref 98–108)
Glucose, Bld: 99 mg/dL (ref 73–118)
Potassium: 3.9 mEq/L (ref 3.3–4.7)
SODIUM: 142 meq/L (ref 128–145)
TOTAL PROTEIN: 7.2 g/dL (ref 6.4–8.1)
Total Bilirubin: 1.1 mg/dl (ref 0.20–1.60)

## 2014-02-27 LAB — LACTATE DEHYDROGENASE: LDH: 178 U/L (ref 94–250)

## 2014-02-27 NOTE — Progress Notes (Signed)
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 She is doing very well. She has plenty of energy. She had her yearly dermatology exam last week and everything was normal. She has noticed no new spots. She has had no new illnesses. She denies fever, chills, n/v, cough, rash, lymphedema, headache, dizziness, SOB, chest pain, palpitations, abdominal pain, constipation, diarrhea, blood in urine or stool. She has had no bleeding or pain. She has some swelling in her legs that is relieved when she elevates them. This is not a new issue for her. She denies tenderness, numbness or tingling in her extremities. Her appetite is good and she is drinking plenty of fluids. She plans on losing some weight with exercise and eating a healthier diet.   CURRENT TREATMENT: Observation  REVIEW OF SYSTEMS: All other 10 point review of systems is negative.   PAST MEDICAL HISTORY: Past Medical History  Diagnosis Date  . Hernia   . Hyperlipidemia   . Supraventricular tachycardia 2007    once- cardiologist- Sara Bryan; yearly  . Cancer     Melonoma   PAST SURGICAL HISTORY: Past Surgical History  Procedure Laterality Date  . Cesarean section    . Melanoma excision      from back in 2007  . Hernia repair  1975    abd wall  . Hernia repair    . Hernia repair    . Hernia repair     FAMILY HISTORY Family History  Problem Relation Age of Onset  . Cancer Mother     Breast  . Arthritis Father     RA  . Heart disease Father     cardiomegaly   GYNECOLOGIC HISTORY:  No LMP recorded. Patient is postmenopausal.   SOCIAL HISTORY:  ADVANCED DIRECTIVES:   HEALTH MAINTENANCE: History  Substance Use Topics  .  Smoking status: Former Smoker -- 0.50 packs/day for 20 years    Types: Cigarettes    Start date: 08/30/1959    Quit date: 01/23/1980  . Smokeless tobacco: Never Used     Comment: quit 33 years ago  . Alcohol Use: No   Colonoscopy: PAP: Bone density: Lipid panel:  Allergies  Allergen Reactions  . Levaquin [Levofloxacin]     Diarrhea,    Current Outpatient Prescriptions  Medication Sig Dispense Refill  . b complex vitamins tablet Take 1 tablet by mouth daily.        Marland Kitchen CALCIUM PO Take 1,000 mg by mouth daily.       . Cholecalciferol (VITAMIN D PO) Take 5,000 Units by mouth daily. TAKES 6000 UNITS DAILY.      Marland Kitchen Coenzyme Q10 (CO Q-10 PO) Take 100 mg by mouth daily.       . fish oil-omega-3 fatty acids 1000 MG capsule Take 1 g by mouth daily.        Marland Kitchen levothyroxine (SYNTHROID) 50 MCG tablet Take 50 mcg by mouth daily.      . Multiple Vitamins-Minerals (MULTIVITAMINS THER. W/MINERALS) TABS Take 1 tablet by mouth daily.        . Probiotic Product (PROBIOTIC DAILY PO) Take by mouth every morning.      . rosuvastatin (CRESTOR) 10 MG tablet Take 10 mg by mouth  daily.       . vitamin C (ASCORBIC ACID) 500 MG tablet Take 500 mg by mouth daily.         No current facility-administered medications for this visit.   OBJECTIVE: Filed Vitals:   02/27/14 0901  BP: 153/69  Pulse: 65  Temp: 97.6 F (36.4 C)  Resp: 16   Body mass index is 34.76 kg/(m^2). ECOG FS:0 - Asymptomatic Ocular: Sclerae unicteric, pupils equal, round and reactive to light Ear-nose-throat: Oropharynx clear, dentition fair Lymphatic: No cervical or supraclavicular adenopathy Lungs no rales or rhonchi, good excursion bilaterally Heart regular rate and rhythm, no murmur appreciated Abd soft, nontender, positive bowel sounds MSK no focal spinal tenderness, no joint edema Neuro: non-focal, well-oriented, appropriate affect Breasts: Deferred  LAB RESULTS: CMP     Component Value Date/Time   NA 142 02/27/2014 0833    NA 139 08/29/2013 0846   K 3.9 02/27/2014 0833   K 4.7 08/29/2013 0846   CL 100 02/27/2014 0833   CL 101 08/29/2013 0846   CO2 26 02/27/2014 0833   CO2 27 08/29/2013 0846   GLUCOSE 99 02/27/2014 0833   GLUCOSE 101* 08/29/2013 0846   BUN 18 02/27/2014 0833   BUN 19 08/29/2013 0846   CREATININE 1.0 02/27/2014 0833   CREATININE 0.72 08/29/2013 0846   CALCIUM 8.8 02/27/2014 0833   CALCIUM 9.5 08/29/2013 0846   PROT 7.2 02/27/2014 0833   PROT 7.2 08/29/2013 0846   ALBUMIN 3.9 08/29/2013 0846   AST 24 02/27/2014 0833   AST 19 08/29/2013 0846   ALT 26 02/27/2014 0833   ALT 20 08/29/2013 0846   ALKPHOS 64 02/27/2014 0833   ALKPHOS 81 08/29/2013 0846   BILITOT 1.10 02/27/2014 0833   BILITOT 0.9 08/29/2013 0846   GFRNONAA 84* 04/14/2011 1312   GFRAA >90 04/14/2011 1312   No results found for this basename: SPEP, UPEP,  kappa and lambda light chains   Lab Results  Component Value Date   WBC 8.2 02/27/2014   NEUTROABS 4.7 02/27/2014   HGB 15.0 02/27/2014   HCT 45.1 02/27/2014   MCV 92 02/27/2014   PLT 277 02/27/2014   No results found for this basename: LABCA2   No components found with this basename: [id number]   No results found for this basename: INR,  in the last 168 hours  STUDIES: No results found.  ASSESSMENT/PLAN: Sara Bryan is a very charming 73 year old white female with stage IB melanoma of the right upper arm. This was resected in November 2012. She also had a melanoma on her back in 2005. She is asymptomatic. I do not see any evidence of recurrence. Her labs today looked normal.  We will see her back in 6 months for labs and follow-up.  She knows to call here with any questions or concerns and to go to the ED in the event of an emergency. We can certainly see her sooner if need be.   Sara Bottom, NP 02/27/2014 10:06 AM

## 2014-03-20 ENCOUNTER — Ambulatory Visit: Payer: Medicare Other | Admitting: Podiatry

## 2014-08-15 ENCOUNTER — Emergency Department (HOSPITAL_COMMUNITY): Payer: Medicare Other

## 2014-08-15 ENCOUNTER — Emergency Department (HOSPITAL_COMMUNITY)
Admission: EM | Admit: 2014-08-15 | Discharge: 2014-08-15 | Disposition: A | Discharge status: Home / Self Care | Payer: Medicare Other | Attending: Emergency Medicine | Admitting: Emergency Medicine

## 2014-08-15 ENCOUNTER — Encounter (HOSPITAL_COMMUNITY): Payer: Self-pay | Admitting: *Deleted

## 2014-08-15 DIAGNOSIS — Z87891 Personal history of nicotine dependence: Secondary | ICD-10-CM | POA: Insufficient documentation

## 2014-08-15 DIAGNOSIS — K8020 Calculus of gallbladder without cholecystitis without obstruction: Secondary | ICD-10-CM | POA: Insufficient documentation

## 2014-08-15 DIAGNOSIS — Z85820 Personal history of malignant melanoma of skin: Secondary | ICD-10-CM | POA: Insufficient documentation

## 2014-08-15 DIAGNOSIS — Z8719 Personal history of other diseases of the digestive system: Secondary | ICD-10-CM | POA: Diagnosis not present

## 2014-08-15 DIAGNOSIS — E785 Hyperlipidemia, unspecified: Secondary | ICD-10-CM | POA: Diagnosis not present

## 2014-08-15 DIAGNOSIS — Z79899 Other long term (current) drug therapy: Secondary | ICD-10-CM | POA: Diagnosis not present

## 2014-08-15 DIAGNOSIS — R079 Chest pain, unspecified: Secondary | ICD-10-CM | POA: Insufficient documentation

## 2014-08-15 DIAGNOSIS — Z8679 Personal history of other diseases of the circulatory system: Secondary | ICD-10-CM | POA: Insufficient documentation

## 2014-08-15 LAB — BASIC METABOLIC PANEL
ANION GAP: 7 (ref 5–15)
BUN: 19 mg/dL (ref 6–23)
CO2: 29 mmol/L (ref 19–32)
Calcium: 9.5 mg/dL (ref 8.4–10.5)
Chloride: 104 mmol/L (ref 96–112)
Creatinine, Ser: 0.76 mg/dL (ref 0.50–1.10)
GFR calc Af Amer: >90 mL/min (ref >90)
GFR calc non Af Amer: 80 mL/min — ABNORMAL LOW (ref >90)
Glucose, Bld: 124 mg/dL — ABNORMAL HIGH (ref 70–99)
POTASSIUM: 4.6 mmol/L (ref 3.5–5.1)
SODIUM: 140 mmol/L (ref 135–145)

## 2014-08-15 LAB — HEPATIC FUNCTION PANEL
ALT: 43 U/L — ABNORMAL HIGH (ref 0–35)
AST: 71 U/L — ABNORMAL HIGH (ref 0–37)
Albumin: 3.7 g/dL (ref 3.5–5.2)
Alkaline Phosphatase: 85 U/L (ref 39–117)
BILIRUBIN DIRECT: 0.4 mg/dL (ref 0.0–0.5)
BILIRUBIN INDIRECT: 0.7 mg/dL (ref 0.3–0.9)
BILIRUBIN TOTAL: 1.1 mg/dL (ref 0.3–1.2)
Total Protein: 7.3 g/dL (ref 6.0–8.3)

## 2014-08-15 LAB — CBC
HEMATOCRIT: 46.6 % — AB (ref 36.0–46.0)
Hemoglobin: 15.1 g/dL — ABNORMAL HIGH (ref 12.0–15.0)
MCH: 29.7 pg (ref 26.0–34.0)
MCHC: 32.4 g/dL (ref 30.0–36.0)
MCV: 91.7 fL (ref 78.0–100.0)
Platelets: 302 10*3/uL (ref 150–400)
RBC: 5.08 MIL/uL (ref 3.87–5.11)
RDW: 13.3 % (ref 11.5–15.5)
WBC: 10.1 10*3/uL (ref 4.0–10.5)

## 2014-08-15 LAB — I-STAT TROPONIN, ED: TROPONIN I, POC: 0 ng/mL (ref 0.00–0.08)

## 2014-08-15 LAB — LIPASE, BLOOD: LIPASE: 43 U/L (ref 11–59)

## 2014-08-15 LAB — BRAIN NATRIURETIC PEPTIDE: B Natriuretic Peptide: 34.2 pg/mL (ref 0.0–100.0)

## 2014-08-15 MED ORDER — ONDANSETRON 8 MG PO TBDP: 8.0000 mg | ORAL_TABLET | Freq: Three times a day (TID) | ORAL | Status: DC | PRN

## 2014-08-15 MED ORDER — HYDROCODONE-ACETAMINOPHEN 5-325 MG PO TABS: 1.0000 | ORAL_TABLET | Freq: Four times a day (QID) | ORAL | Status: DC | PRN

## 2014-08-15 MED ORDER — ASPIRIN 325 MG PO TABS
325.0000 mg | ORAL_TABLET | ORAL | Status: AC
  Administered 2014-08-15: 325 mg via ORAL
  Filled 2014-08-15: qty 1

## 2014-08-15 NOTE — Discharge Instructions (Signed)
Gallbladder  Take medications as prescribed.  Call the surgery clinic later today to schedule a follow up visit for your gallstones.  Return to the ER for worsening pain, vomiting despite medications, fever, or other new concerning symptoms.   PAIN ACETAMINOPHEN HYDROCODONE  PAIN ACETAMINOPHEN HYDROCODONE: You have been given a medication that contains acetaminophen and hydrocodone.      This medication is used to relieve pain.     DO NOT take this medication if you have liver disease or drink alcohol on a daily basis.     DO NOT take this medication if you are taking other over-the-counter medications that contain Tylenol or acetaminophen (the active ingredient in Tylenol).     If you have side-effects that you think are caused by this medicine, tell your doctor.     DO NOT drink alcoholic beverages while taking this medicine.     If you become dizzy, sit or lie down at the first signs.  You should be careful going up and down stairs.     If you are pregnant or breastfeeding, notify your doctor before taking this medication.     Keep this medication out of the reach of children.  Always keep this medication in child-proof containers.  DO NOT give your medication to anyone else. This medication can be HABIT-FORMING.  Discontinue use when no longer needed and never give this medication to others.  You have been given a medication, or a prescription for a medication, that causes drowsiness or dizziness.  DO NOT drive a car, operate machinery, or perform jobs that require you to be alert until you know how you are going to react to this medicine.  THESE INSTRUCTIONS ARE NOT COMPREHENSIVE (complete):  Ask your pharmacist for additional information and precautions for this medication.   GI ANTIEMETIC  GI ANTIEMETIC: You have been given a prescription for a medication for nausea and vomiting.      It is OK to take this medication if you are pregnant.  Be sure to tell your regular  doctor or obstetrician [facility] doctor) that you have been taking this medication.     Take this medication as directed.     If you are taking phenobarbital, narcotic pain medications, antidepressants, or sleeping pills your dosage may need to be adjusted.  Be sure to inform your doctor of all the other medications that you are taking.     DO NOT take this medication if you have liver disease or heart disease.     DO NOT take pain killers (narcotic medication) unless specifically instructed to do so by your doctor     DO NOT drink alcoholic beverages while taking this medicine.     If you develop any reactions that you believe may be from the medication be sure to tell your doctor or return to the ER (Some reactions may include:  dizziness, shaking, visual disturbances, nervousness, fainting, rash).     If you become dizzy, sit or lie down at the first signs.  You should be careful going up and down stairs.     Keep this medication out of the reach of children.  Always keep this medication in child-proof containers.  DO NOT give your medication to anyone else. You have been given a medication, or a prescription for a medication, that causes drowsiness or dizziness.  DO NOT drive a car, operate machinery, ride a bike, or perform jobs that require you to be alert until you know how you  are going to react to this medication.  THESE INSTRUCTIONS ARE NOT COMPREHENSIVE (complete):  Ask your pharmacist for additional information and precautions for this medication.   LOW FAT DIET  Low Fat Diet  Your doctor wants you to be on a low fat diet.  This diet will be helpful if you want to lose weight or if you have problems with your liver, pancreas, gallbladder.  FOODS ALLOWED:    Beverages: All, except those not allowed.   Evaporated skim milk.     Breads: All enriched or whole grain bread, bread sticks, graham crackers, melba toast, pretzels, rye wafers, matzoh, saltines, bagels.     Cereals: All  cooked without fat or dry.     Desserts: All fruit, diet puddings, gelatin, dessert made with egg white, angel food cake, fruit ice, sherbet.     Eggs: Not more than one egg yolk daily, whites okay.  Cholesterol free egg substitutes, such as "egg beaters."     Fat: One teaspoon each meal or 3 teaspoons per day; butter, mayonnaise, margarine, oil. (May be used in cooking if omitted at meals) Fat free salad dressings and gravy.     Fruits: All fresh, frozen, or canned fruit or fruit juice. One citrus fruit every day.     Meats, Fish, Poultry & Cheese: Remove visible fat from meat before cooking. Baked, broiled, boiled, roasted, stewed, simmered; lean fish, meat, poultry, seafood.  Water packed salmon and tuna.  Lowfat cottage cheese, skim milk cheese, ricotta, parmesan, farmer's cheese, lowfat yogurt and tofu.     Potatoes & Substitutes: Macaroni, noodles, rice, spaghetti, sweet or white potato; prepared without fat, unless used in amount allowed.     Soups: Bouillon, cream soups made with vegetables and skim milk, fat free meat and poultry soups.     Sweets: Honey, jam, jelly, marshmallows, molasses, sugar, syrup, candies; hard, Life Savers, gum drops, jelly beans, sour balls.     Vegetables: All fresh, frozen, canned or juiced vegetables allowed.  FOODS NOT ALLOWED:    Beverages: Cream, 2% or whole milk, chocolate milk, condensed milk, evaporated or malted milk or shakes, 1/2 & 1/2.     Breads: Quick breads, muffins, biscuits, pancakes, corn bread, sweet rolls, any fried breads.     Cereals: Bran, if it causes distress, wheat germ.     Desserts: Dessert made with whole milk, cream, butter, lard, oil, coconut, nuts, or chocolate.     Eggs: Eggs prepared with whole milk or fat. Fried eggs.     Fat: More than 1 teaspoon per meal.  Bacon, bacon fat, ham fat, lard, salt pork, shortening, gravy, salad dressings, non-dairy creamers.     Fruits: [place].  Any fruit that causes distress.      Meats, Fish, Estate manager/land agent: All fried or fatty.  Sausage, prime rib, frankfurters, luncheon meats, fish canned in oil, duck, goose, poultry skin, spiced or pickled meats, cheese (except those allowed), whole milk yogurt.  Peanut butter limited to 1 Tbs. day.     Potatoes & Substitutes: Cooked with fat or oil, fried potatoes, potato chips, cream sauces (unless made with skim milk).     Sweets: Chocolate, coconut, nuts, caramels.                Vegetables: Avocado, any cooked in fat or that cause distress.    BILIARY COLIC  BILIARY COLIC: You have been diagnosed with biliary colic.  Biliary colic is the term used to describe crampy pain from  a gallbladder that contains gallstones.  The gallbladder is a small sack that hangs from the liver. It stores a liquid called bile. Bile is produced by the liver. When you eat, the gallbladder squeezes bile through a duct or tube into the intestines to help with the digestion of fat.  Gallstones develop from crystals of bile. The stone may be smaller than a pea, or as large as a golf ball. There may be one or several stones. The stone may block the tube that drains the bile from the gallbladder. This blockage causes spasm of the gallbladder and pain.  The pain usually comes and goes and is crampy.  It often starts after eating foods that contain a lot of fat.  You may also have nausea and vomiting with the pain.  Biliary colic is usually treated with pain medications. You may also be given a medication for the nausea and vomiting.  You should avoid eating foods that are fried or contain a lot of fat.  If you have more episodes of pain, you may need to have your gall bladder removed.      You should contact your family doctor for a referral to a general surgeon. YOU SHOULD SEEK MEDICAL ATTENTION IMMEDIATELY, EITHER HERE OR AT THE NEAREST EMERGENCY DEPARTMENT, IF ANY OF THE FOLLOWING OCCURS:      Increasing pain or pain that does not go  away.     Persistent vomiting or if you are not able to keep any fluids down.     Fever or shaking chills.     Yellowing of your skin or eyes, or dark, brown-colored urine.  If you develop symptoms of Shortness of Breath, Chest Pain, Swelling of lips, mouth or tongue or if your condition becomes worse with any new symptoms, see your doctor or return to the Emergency Department for immediate care. Emergency services are not intended to be a substitute for comprehensive medical attention.  Please contact your doctor for follow up if not improving as expected.   Call your doctor in 5-7 days or as directed if there is no improvement.    Biliary Colic  Biliary colic is a steady or irregular pain in the upper abdomen. It is usually under the right side of the rib cage. It happens when gallstones interfere with the normal flow of bile from the gallbladder. Bile is a liquid that helps to digest fats. Bile is made in the liver and stored in the gallbladder. When you eat a meal, bile passes from the gallbladder through the cystic duct and the common bile duct into the small intestine. There, it mixes with partially digested food. If a gallstone blocks either of these ducts, the normal flow of bile is blocked. The muscle cells in the bile duct contract forcefully to try to move the stone. This causes the pain of biliary colic.  SYMPTOMS   A person with biliary colic usually complains of pain in the upper abdomen. This pain can be:  In the center of the upper abdomen just below the breastbone.  In the upper-right part of the abdomen, near the gallbladder and liver.  Spread back toward the right shoulder blade.  Nausea and vomiting.  The pain usually occurs after eating.  Biliary colic is usually triggered by the digestive system's demand for bile. The demand for bile is high after fatty meals. Symptoms can also occur when a person who has been fasting suddenly eats a very large meal. Most episodes  of  biliary colic pass after 1 to 5 hours. After the most intense pain passes, your abdomen may continue to ache mildly for about 24 hours. DIAGNOSIS  After you describe your symptoms, your caregiver will perform a physical exam. He or she will pay attention to the upper right portion of your belly (abdomen). This is the area of your liver and gallbladder. An ultrasound will help your caregiver look for gallstones. Specialized scans of the gallbladder may also be done. Blood tests may be done, especially if you have fever or if your pain persists. PREVENTION  Biliary colic can be prevented by controlling the risk factors for gallstones. Some of these risk factors, such as heredity, increasing age, and pregnancy are a normal part of life. Obesity and a high-fat diet are risk factors you can change through a healthy lifestyle. Women going through menopause who take hormone replacement therapy (estrogen) are also more likely to develop biliary colic. TREATMENT   Pain medication may be prescribed.  You may be encouraged to eat a fat-free diet.  If the first episode of biliary colic is severe, or episodes of colic keep retuning, surgery to remove the gallbladder (cholecystectomy) is usually recommended. This procedure can be done through small incisions using an instrument called a laparoscope. The procedure often requires a brief stay in the hospital. Some people can leave the hospital the same day. It is the most widely used treatment in people troubled by painful gallstones. It is effective and safe, with no complications in more than 90% of cases.  If surgery cannot be done, medication that dissolves gallstones may be used. This medication is expensive and can take months or years to work. Only small stones will dissolve.  Rarely, medication to dissolve gallstones is combined with a procedure called shock-wave lithotripsy. This procedure uses carefully aimed shock waves to break up gallstones. In many  people treated with this procedure, gallstones form again within a few years. PROGNOSIS  If gallstones block your cystic duct or common bile duct, you are at risk for repeated episodes of biliary colic. There is also a 25% chance that you will develop a gallbladder infection(acute cholecystitis), or some other complication of gallstones within 10 to 20 years. If you have surgery, schedule it at a time that is convenient for you and at a time when you are not sick. HOME CARE INSTRUCTIONS   Drink plenty of clear fluids.  Avoid fatty, greasy or fried foods, or any foods that make your pain worse.  Take medications as directed. SEEK MEDICAL CARE IF:   You develop a fever over 100.5 F (38.1 C).  Your pain gets worse over time.  You develop nausea that prevents you from eating and drinking.  You develop vomiting. SEEK IMMEDIATE MEDICAL CARE IF:   You have continuous or severe belly (abdominal) pain which is not relieved with medications.  You develop nausea and vomiting which is not relieved with medications.  You have symptoms of biliary colic and you suddenly develop a fever and shaking chills. This may signal cholecystitis. Call your caregiver immediately.  You develop a yellow color to your skin or the white part of your eyes (jaundice). Document Released: 10/27/2005 Document Revised: 08/18/2011 Document Reviewed: 01/06/2008 [facility] Patient Information 2015 [place], [place]. This information is not intended to replace advice given to you by your health care provider. Make sure you discuss any questions you have with your health care provider.

## 2014-08-15 NOTE — ED Notes (Addendum)
Pt reports back pain that "wraps around" to her epigastric area.  Pt reports SOB as well.  Pt describes pain as a tightening band around her midriff area and squeezing.  Pt reports pain is intermittent.  Denies any pain at this time.

## 2014-08-15 NOTE — ED Provider Notes (Signed)
CSN: [id number]     Arrival date & time 08/15/14  0116 History   First MD Initiated Contact with Patient 08/15/14 0141     Chief Complaint  Patient presents with  . Chest Pain     (Consider location/radiation/quality/duration/timing/severity/associated sxs/prior Treatment) HPI 74 year old female presents to emergency department from home with complaint of upper abdominal pain that wraps around her abdomen and into her back and radiates slightly into her chest.  Pain is described as a tightening and squeezing.  She had an episode Sunday, lasting a few hours.  Tonight symptoms returned and lasted longer.  She denies previous history of similar pain.  She denies a nausea diaphoresis or shortness of breath.  Patient without history of hypertension, diabetes .  She has a remote history of smoking.  No family history of coronary disease.  She takes Crestor for moderately elevated cholesterol.  No pain with exertion.  Pain at this time has resolved. Past Medical History  Diagnosis Date  . Hernia   . Hyperlipidemia   . Supraventricular tachycardia 2007    once- cardiologist- dr Acie Fredrickson; yearly  . Cancer     Melonoma   Past Surgical History  Procedure Laterality Date  . Cesarean section    . Melanoma excision      from back in 2007  . Hernia repair  1975    abd wall  . Hernia repair    . Hernia repair    . Hernia repair     Family History  Problem Relation Age of Onset  . Cancer Mother     Breast  . Arthritis Father     RA  . Heart disease Father     cardiomegaly   History  Substance Use Topics  . Smoking status: Former Smoker -- 0.50 packs/day for 20 years    Types: Cigarettes    Start date: 08/30/1959    Quit date: 01/23/1980  . Smokeless tobacco: Never Used     Comment: quit 33 years ago  . Alcohol Use: No   OB History    No data available     Review of Systems   See History of Present Illness; otherwise all other systems are reviewed and negative  Allergies   Levaquin  Home Medications   Prior to Admission medications   Medication Sig Start Date End Date Taking? Authorizing Provider  b complex vitamins tablet Take 1 tablet by mouth daily.      Historical Provider, MD  CALCIUM PO Take 1,000 mg by mouth daily.     Historical Provider, MD  Cholecalciferol (VITAMIN D PO) Take 5,000 Units by mouth daily. TAKES 6000 UNITS DAILY.    Historical Provider, MD  Coenzyme Q10 (CO Q-10 PO) Take 100 mg by mouth daily.     Historical Provider, MD  fish oil-omega-3 fatty acids 1000 MG capsule Take 1 g by mouth daily.      Historical Provider, MD  levothyroxine (SYNTHROID) 50 MCG tablet Take 50 mcg by mouth daily.    Historical Provider, MD  Multiple Vitamins-Minerals (MULTIVITAMINS THER. W/MINERALS) TABS Take 1 tablet by mouth daily.      Historical Provider, MD  Probiotic Product (PROBIOTIC DAILY PO) Take by mouth every morning.    Historical Provider, MD  rosuvastatin (CRESTOR) 10 MG tablet Take 10 mg by mouth daily.     Historical Provider, MD  vitamin C (ASCORBIC ACID) 500 MG tablet Take 500 mg by mouth daily.      Historical Provider, MD  BP 130/48 mmHg  Pulse 68  Temp(Src) 98.7 F (37.1 C) (Oral)  Resp 20  SpO2 97% Physical Exam  Constitutional: She is oriented to person, place, and time. She appears well-developed and well-nourished. No distress.  HENT:  Head: Normocephalic and atraumatic.  Nose: Nose normal.  Mouth/Throat: Oropharynx is clear and moist.  Eyes: Conjunctivae and EOM are normal. Pupils are equal, round, and reactive to light.  Neck: Normal range of motion. Neck supple. No JVD present. No tracheal deviation present. No thyromegaly present.  Cardiovascular: Normal rate, regular rhythm, normal heart sounds and intact distal pulses.  Exam reveals no gallop and no friction rub.   No murmur heard. Pulmonary/Chest: Effort normal and breath sounds normal. No stridor. No respiratory distress. She has no wheezes. She has no rales. She  exhibits no tenderness.  Abdominal: Soft. Bowel sounds are normal. She exhibits no distension and no mass. There is tenderness (patient has very mild tenderness in right upper quadrant). There is no rebound and no guarding.  Musculoskeletal: Normal range of motion. She exhibits no edema or tenderness.  Lymphadenopathy:    She has no cervical adenopathy.  Neurological: She is alert and oriented to person, place, and time. She displays normal reflexes. She exhibits normal muscle tone. Coordination normal.  Skin: Skin is warm and dry. No rash noted. No erythema. No pallor.  Psychiatric: She has a normal mood and affect. Her behavior is normal. Judgment and thought content normal.  Nursing note and vitals reviewed.   ED Course  Procedures (including critical care time) Labs Review Labs Reviewed  CBC - Abnormal; Notable for the following:    Hemoglobin 15.1 (*)    HCT 46.6 (*)    All other components within normal limits  BASIC METABOLIC PANEL - Abnormal; Notable for the following:    Glucose, Bld 124 (*)    GFR calc non Af Amer 80 (*)    All other components within normal limits  HEPATIC FUNCTION PANEL - Abnormal; Notable for the following:    AST 71 (*)    ALT 43 (*)    All other components within normal limits  BRAIN NATRIURETIC PEPTIDE  LIPASE, BLOOD  I-STAT TROPOININ, ED    Imaging Review Dg Chest 2 View  08/15/2014   CLINICAL DATA:  Intermittent epigastric pain for few weeks, worsening today. History of melanoma. Concern for cholecystitis.  EXAM: CHEST  2 VIEW  COMPARISON:  Chest radiograph April 14, 2011  FINDINGS: Cardiac silhouette is upper limits normal in size. Mildly tortuous calcified aorta. Bilateral lung base scarring. No pleural effusion or focal consolidation. No pneumothorax. Moderate degenerative change of included lumbar spine.  IMPRESSION: Borderline cardiomegaly.  Bibasilar atelectasis/scarring.   Electronically Signed   By: Elon Alas   On: 08/15/2014 03:20    US Abdomen Limited  08/15/2014   CLINICAL DATA:  Chest pain.  EXAM: US ABDOMEN LIMITED - RIGHT UPPER QUADRANT  COMPARISON:  CT 10/05/2012  FINDINGS: Gallbladder:  Multiple shadowing gallstones. No wall thickening visualized. No sonographic Murphy sign noted.  Common bile duct:  Diameter: 3.4 mm, normal.  Liver:  No focal lesion identified. Diffusely increased and coarsened in parenchymal echogenicity, difficult to penetrate.  IMPRESSION: 1. Cholelithiasis without findings of cholecystitis or biliary dilatation. 2. Hepatic steatosis.   Electronically Signed   By: Jeb Levering M.D.   On: 08/15/2014 04:42     EKG Interpretation   Date/Time:  Tuesday August 15 2014 01:29:05 EST Ventricular Rate:  67 PR Interval:  166 QRS Duration: 71 QT Interval:  373 QTC Calculation: 394 R Axis:   47 Text Interpretation:  Sinus rhythm Low voltage, precordial leads Baseline  wander in lead(s) II III aVF V5 Confirmed by Hanni Milford  MD, Ayvin Lipinski ([id number]) on  08/15/2014 2:43:39 AM      MDM   Final diagnoses:  Chest pain  Cholelithiasis without cholecystitis    74 year old female with 2 episodes of bandlike pain around her upper abdomen and into her chest.  EKG and troponin are negative.  She is low risk for coronary disease.  I suspect symptoms are secondary to biliary colic and will check ultrasound.  Patient with mild elevation in her AST and ALT.  She is also noted to have multiple gallstones.  Patient instructed on low fat diet and follow-up with surgery.    Linton Flemings, MD 08/15/14 [phone]

## 2014-08-21 ENCOUNTER — Ambulatory Visit: Payer: Self-pay | Admitting: Podiatry

## 2014-08-21 ENCOUNTER — Ambulatory Visit (INDEPENDENT_AMBULATORY_CARE_PROVIDER_SITE_OTHER): Payer: Medicare Other | Admitting: Podiatry

## 2014-08-21 ENCOUNTER — Encounter: Payer: Self-pay | Admitting: Podiatry

## 2014-08-21 VITALS — BP 121/67 | HR 64 | Resp 12

## 2014-08-21 DIAGNOSIS — M779 Enthesopathy, unspecified: Secondary | ICD-10-CM

## 2014-08-21 MED ORDER — TRIAMCINOLONE ACETONIDE 10 MG/ML IJ SUSP
10.0000 mg | Freq: Once | INTRAMUSCULAR | Status: AC
  Administered 2014-08-21: 10 mg

## 2014-08-22 NOTE — Progress Notes (Signed)
Subjective:     Patient ID: Sara Bryan, female   DOB: 06-01-1941, 74 y.o.   MRN: [id number]  HPI patient states I'm doing pretty well but my orthotics are wearing out and I'm also getting pain in the midfoot of both my feet   Review of Systems     Objective:   Physical Exam Neurovascular status intact muscle strength was adequate and range of motion subtalar midtarsal joint within normal limits. Patient is noted to have moderate depression of the arch still noted    Assessment:     Midfoot tendinitis bilateral with probably underlying arthritis and flatfoot deformity bilateral    Plan:     Injected the midfoot area bilateral 3 mg Kenalog 5 mg Xylocaine and scanned for orthotic therapy at this time

## 2014-08-28 ENCOUNTER — Ambulatory Visit (HOSPITAL_BASED_OUTPATIENT_CLINIC_OR_DEPARTMENT_OTHER): Payer: Medicare Other | Admitting: Hematology & Oncology

## 2014-08-28 ENCOUNTER — Encounter: Payer: Self-pay | Admitting: Hematology & Oncology

## 2014-08-28 ENCOUNTER — Other Ambulatory Visit (HOSPITAL_BASED_OUTPATIENT_CLINIC_OR_DEPARTMENT_OTHER): Payer: Medicare Other | Admitting: Lab

## 2014-08-28 VITALS — BP 140/63 | HR 59 | Temp 97.3°F | Resp 14 | Ht 67.0 in | Wt 212.0 lb

## 2014-08-28 DIAGNOSIS — Z85820 Personal history of malignant melanoma of skin: Secondary | ICD-10-CM

## 2014-08-28 DIAGNOSIS — C4361 Malignant melanoma of right upper limb, including shoulder: Secondary | ICD-10-CM

## 2014-08-28 LAB — LACTATE DEHYDROGENASE: LDH: 164 U/L (ref 94–250)

## 2014-08-28 LAB — COMPREHENSIVE METABOLIC PANEL
ALT: 25 U/L (ref 0–35)
AST: 18 U/L (ref 0–37)
Albumin: 4 g/dL (ref 3.5–5.2)
Alkaline Phosphatase: 87 U/L (ref 39–117)
BUN: 21 mg/dL (ref 6–23)
CHLORIDE: 104 meq/L (ref 96–112)
CO2: 26 mEq/L (ref 19–32)
CREATININE: 0.83 mg/dL (ref 0.50–1.10)
Calcium: 9.4 mg/dL (ref 8.4–10.5)
Glucose, Bld: 96 mg/dL (ref 70–99)
Potassium: 4.3 mEq/L (ref 3.5–5.3)
Sodium: 139 mEq/L (ref 135–145)
Total Bilirubin: 1.5 mg/dL — ABNORMAL HIGH (ref 0.2–1.2)
Total Protein: 7.4 g/dL (ref 6.0–8.3)

## 2014-08-28 LAB — CBC WITH DIFFERENTIAL (CANCER CENTER ONLY)
BASO#: 0 10*3/uL (ref 0.0–0.2)
BASO%: 0.4 % (ref 0.0–2.0)
EOS ABS: 0.1 10*3/uL (ref 0.0–0.5)
EOS%: 1.3 % (ref 0.0–7.0)
HCT: 47.7 % — ABNORMAL HIGH (ref 34.8–46.6)
HGB: 16.1 g/dL — ABNORMAL HIGH (ref 11.6–15.9)
LYMPH#: 2.7 10*3/uL (ref 0.9–3.3)
LYMPH%: 28.9 % (ref 14.0–48.0)
MCH: 30.3 pg (ref 26.0–34.0)
MCHC: 33.8 g/dL (ref 32.0–36.0)
MCV: 90 fL (ref 81–101)
MONO#: 1 10*3/uL — AB (ref 0.1–0.9)
MONO%: 10.8 % (ref 0.0–13.0)
NEUT%: 58.6 % (ref 39.6–80.0)
NEUTROS ABS: 5.4 10*3/uL (ref 1.5–6.5)
PLATELETS: 305 10*3/uL (ref 145–400)
RBC: 5.31 10*6/uL (ref 3.70–5.32)
RDW: 13.5 % (ref 11.1–15.7)
WBC: 9.3 10*3/uL (ref 3.9–10.0)

## 2014-08-28 NOTE — Progress Notes (Signed)
Hematology and Oncology Follow Up Visit  [name] [id number] 03-14-1941 74 y.o. 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 is back for follow-up. We see her every 6 months. She also sees her dermatologist every 6 months.  She had the melanoma resected back in November 2012.  She's doing well. She still working. She's had no problem with nausea or vomiting. There's been no cough present no bony pain. His been no change in bowel or bladder habits.  She has had some lesions resected by her dermatologist.  She's had her mammograms on a routine basis. She says she is due for another one.  Medications:  Current outpatient prescriptions:  .  aspirin EC 81 MG tablet, Take 81 mg by mouth daily., Disp: , Rfl:  .  b complex vitamins tablet, Take 1 tablet by mouth daily.  , Disp: , Rfl:  .  CALCIUM PO, Take 1,000 mg by mouth daily. , Disp: , Rfl:  .  CHOLECALCIFEROL PO, Take 6,000 Units by mouth daily., Disp: , Rfl:  .  Coenzyme Q10 (CO Q-10 PO), Take 100 mg by mouth daily. , Disp: , Rfl:  .  fish oil-omega-3 fatty acids 1000 MG capsule, Take 1 g by mouth daily.  , Disp: , Rfl:  .  HYDROcodone-acetaminophen (NORCO/VICODIN) 5-325 MG per tablet, Take 1-2 tablets by mouth every 6 (six) hours as needed for moderate pain or severe pain., Disp: 10 tablet, Rfl: 0 .  levothyroxine (SYNTHROID) 50 MCG tablet, Take 50 mcg by mouth daily., Disp: , Rfl:  .  Multiple Vitamins-Minerals (MULTIVITAMINS THER. W/MINERALS) TABS, Take 1 tablet by mouth daily.  , Disp: , Rfl:  .  ondansetron (ZOFRAN ODT) 8 MG disintegrating tablet, Take 1 tablet (8 mg total) by mouth every 8 (eight) hours as needed for nausea or vomiting., Disp: 20 tablet, Rfl: 0 .  Probiotic Product (PROBIOTIC DAILY PO), Take by mouth every morning., Disp: , Rfl:  .  rosuvastatin (CRESTOR) 10 MG tablet, Take 10 mg by mouth  daily. , Disp: , Rfl:  .  vitamin C (ASCORBIC ACID) 500 MG tablet, Take 500 mg by mouth daily.  , Disp: , Rfl:   Allergies:  Allergies  Allergen Reactions  . Levaquin [Levofloxacin] Other (See Comments)    Diarrhea/Muscle weakness/inability to lift arms/limbs    Past Medical History, Surgical history, Social history, and Family History were reviewed and updated.  Review of Systems: As above  Physical Exam:  height is 5\' 7"  (1.702 m) and weight is 212 lb (96.163 kg). Her oral temperature is 97.3 F (36.3 C). Her blood pressure is 140/63 and her pulse is 59. Her respiration is 14.   Wt Readings from Last 3 Encounters:  08/28/14 212 lb (96.163 kg)  02/27/14 222 lb (100.699 kg)  08/29/13 218 lb (98.884 kg)     Well-developed well-nourished white female in no obvious distress. Head and neck exam shows no ocular or oral lesions. There are no palpable cervical or supraclavicular lymph nodes. Lungs are clear. Cardiac exam regular rate and rhythm with no murmurs, rubs or bruits. Abdomen is soft. She has good bowel sounds. There is a palpable abdominal mass. Is no fluid wave. There is a palpable liver or spleen tip. Axillary exam shows no bilateral axillary adenopathy. Extremities shows no clubbing, cyanosis or edema. She has a well-healed wide local excision scar  on the right upper arm. This is in the back of the distal portion of the right upper arm. Skin exam shows no suspicious hyperpigmented lesions.  Lab Results  Component Value Date   WBC 9.3 08/28/2014   HGB 16.1* 08/28/2014   HCT 47.7* 08/28/2014   MCV 90 08/28/2014   PLT 305 08/28/2014     Chemistry      Component Value Date/Time   NA 140 08/15/2014 0150   NA 142 02/27/2014 0833   K 4.6 08/15/2014 0150   K 3.9 02/27/2014 0833   CL 104 08/15/2014 0150   CL 100 02/27/2014 0833   CO2 29 08/15/2014 0150   CO2 26 02/27/2014 0833   BUN 19 08/15/2014 0150   BUN 18 02/27/2014 0833   CREATININE 0.76 08/15/2014 0150    CREATININE 1.0 02/27/2014 0833      Component Value Date/Time   CALCIUM 9.5 08/15/2014 0150   CALCIUM 8.8 02/27/2014 0833   ALKPHOS 85 08/15/2014 0150   ALKPHOS 64 02/27/2014 0833   AST 71* 08/15/2014 0150   AST 24 02/27/2014 0833   ALT 43* 08/15/2014 0150   ALT 26 02/27/2014 0833   BILITOT 1.1 08/15/2014 0150   BILITOT 1.10 02/27/2014 0833         Impression and Plan: Ms. Camberos is 74 year old white female with a resected mellows stage IB melanoma of the right arm. Provider not see any evidence of recurrence or a second melanoma.  We will continue to follow her along every 6 months.  She lost some weight. This always will be helpful.  Of note, she did have an ultrasound of the abdomen back in early March. It does show some gallstones. Hopefully, she will not need a cholecystectomy at some point.   [name], MD 3/21/201611:51 AM

## 2014-10-03 ENCOUNTER — Ambulatory Visit: Payer: Medicare Other | Admitting: *Deleted

## 2014-10-03 DIAGNOSIS — M779 Enthesopathy, unspecified: Secondary | ICD-10-CM

## 2014-10-03 NOTE — Progress Notes (Signed)
Patient ID: Sara Bryan, female   DOB: 05-07-1941, 74 y.o.   MRN: [id number] PICKING UP INSERTS

## 2014-10-03 NOTE — Patient Instructions (Signed)

## 2014-12-26 ENCOUNTER — Other Ambulatory Visit: Payer: Self-pay | Admitting: Vascular Surgery

## 2014-12-26 ENCOUNTER — Ambulatory Visit (HOSPITAL_COMMUNITY)
Admission: RE | Admit: 2014-12-26 | Discharge: 2014-12-26 | Disposition: A | Discharge status: Home / Self Care | Payer: Medicare Other | Source: Ambulatory Visit | Attending: Vascular Surgery | Admitting: Vascular Surgery

## 2014-12-26 DIAGNOSIS — I639 Cerebral infarction, unspecified: Secondary | ICD-10-CM

## 2015-01-08 ENCOUNTER — Encounter: Payer: Self-pay | Admitting: Hematology & Oncology

## 2015-02-26 ENCOUNTER — Encounter: Payer: Self-pay | Admitting: Hematology & Oncology

## 2015-02-26 ENCOUNTER — Ambulatory Visit (HOSPITAL_BASED_OUTPATIENT_CLINIC_OR_DEPARTMENT_OTHER): Payer: Medicare Other | Admitting: Hematology & Oncology

## 2015-02-26 ENCOUNTER — Other Ambulatory Visit (HOSPITAL_BASED_OUTPATIENT_CLINIC_OR_DEPARTMENT_OTHER): Payer: Medicare Other

## 2015-02-26 ENCOUNTER — Ambulatory Visit (HOSPITAL_BASED_OUTPATIENT_CLINIC_OR_DEPARTMENT_OTHER): Payer: Medicare Other

## 2015-02-26 VITALS — BP 138/66 | HR 60 | Temp 98.0°F | Resp 16 | Ht 67.0 in | Wt 196.0 lb

## 2015-02-26 DIAGNOSIS — Z23 Encounter for immunization: Secondary | ICD-10-CM | POA: Diagnosis not present

## 2015-02-26 DIAGNOSIS — C4361 Malignant melanoma of right upper limb, including shoulder: Secondary | ICD-10-CM

## 2015-02-26 LAB — CBC WITH DIFFERENTIAL (CANCER CENTER ONLY)
BASO#: 0 10*3/uL (ref 0.0–0.2)
BASO%: 0.4 % (ref 0.0–2.0)
EOS ABS: 0.2 10*3/uL (ref 0.0–0.5)
EOS%: 2.7 % (ref 0.0–7.0)
HCT: 46.6 % (ref 34.8–46.6)
HEMOGLOBIN: 15.7 g/dL (ref 11.6–15.9)
LYMPH#: 2.5 10*3/uL (ref 0.9–3.3)
LYMPH%: 32.7 % (ref 14.0–48.0)
MCH: 30.3 pg (ref 26.0–34.0)
MCHC: 33.7 g/dL (ref 32.0–36.0)
MCV: 90 fL (ref 81–101)
MONO#: 0.9 10*3/uL (ref 0.1–0.9)
MONO%: 11.6 % (ref 0.0–13.0)
NEUT#: 4 10*3/uL (ref 1.5–6.5)
NEUT%: 52.6 % (ref 39.6–80.0)
PLATELETS: 274 10*3/uL (ref 145–400)
RBC: 5.19 10*6/uL (ref 3.70–5.32)
RDW: 13.2 % (ref 11.1–15.7)
WBC: 7.6 10*3/uL (ref 3.9–10.0)

## 2015-02-26 LAB — CMP (CANCER CENTER ONLY)
ALT(SGPT): 27 U/L (ref 10–47)
AST: 29 U/L (ref 11–38)
Albumin: 3.5 g/dL (ref 3.3–5.5)
Alkaline Phosphatase: 71 U/L (ref 26–84)
BUN: 21 mg/dL (ref 7–22)
CHLORIDE: 105 meq/L (ref 98–108)
CO2: 27 mEq/L (ref 18–33)
Calcium: 9.5 mg/dL (ref 8.0–10.3)
Creat: 0.5 mg/dl — ABNORMAL LOW (ref 0.6–1.2)
Glucose, Bld: 95 mg/dL (ref 73–118)
Potassium: 4.1 mEq/L (ref 3.3–4.7)
Sodium: 139 mEq/L (ref 128–145)
TOTAL PROTEIN: 7.3 g/dL (ref 6.4–8.1)
Total Bilirubin: 1.9 mg/dl — ABNORMAL HIGH (ref 0.20–1.60)

## 2015-02-26 LAB — LACTATE DEHYDROGENASE: LDH: 179 U/L (ref 94–250)

## 2015-02-26 MED ORDER — INFLUENZA VAC SPLIT QUAD 0.5 ML IM SUSY
0.5000 mL | PREFILLED_SYRINGE | Freq: Once | INTRAMUSCULAR | Status: AC
  Administered 2015-02-26: 0.5 mL via INTRAMUSCULAR
  Filled 2015-02-26: qty 0.5

## 2015-02-26 NOTE — Patient Instructions (Signed)

## 2015-02-26 NOTE — Progress Notes (Signed)
Hematology and Oncology Follow Up Visit  Sara Bryan [id number] 1940-12-03 74 y.o. 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 We see her every year. In the past year, she's done well. She is losing weight. She is quite happy about this.  She works in a greenhouse. She sustained a spider bite in the left lower leg. She is just putting over-the-counter lotion on this.  She's had no problems with nausea vomiting. She's had no change in bowel or bladder habits. She is up-to-date with her mammograms.  I told that she probably needs to have one more colonoscopy. I think that if this next colonoscopy is normal, then she does not need any further colonoscopies.  She's had no rashes.  She's had no headache.  Overall, her performance status is ECOG 1.     Medications:  Current outpatient prescriptions:  .  aspirin EC 81 MG tablet, Take 81 mg by mouth daily., Disp: , Rfl:  .  b complex vitamins tablet, Take 1 tablet by mouth daily.  , Disp: , Rfl:  .  CALCIUM PO, Take 1,000 mg by mouth daily. , Disp: , Rfl:  .  CHOLECALCIFEROL PO, Take 6,000 Units by mouth daily., Disp: , Rfl:  .  Coenzyme Q10 (CO Q-10 PO), Take 100 mg by mouth daily. , Disp: , Rfl:  .  fish oil-omega-3 fatty acids 1000 MG capsule, Take 1 g by mouth daily.  , Disp: , Rfl:  .  HYDROcodone-acetaminophen (NORCO/VICODIN) 5-325 MG per tablet, Take 1-2 tablets by mouth every 6 (six) hours as needed for moderate pain or severe pain., Disp: 10 tablet, Rfl: 0 .  levothyroxine (SYNTHROID) 50 MCG tablet, Take 50 mcg by mouth daily., Disp: , Rfl:  .  Multiple Vitamins-Minerals (MULTIVITAMINS THER. W/MINERALS) TABS, Take 1 tablet by mouth daily.  , Disp: , Rfl:  .  ondansetron (ZOFRAN ODT) 8 MG disintegrating tablet, Take 1 tablet (8 mg total) by mouth every 8 (eight) hours as needed for nausea  or vomiting., Disp: 20 tablet, Rfl: 0 .  Probiotic Product (PROBIOTIC DAILY PO), Take by mouth every morning., Disp: , Rfl:  .  rosuvastatin (CRESTOR) 10 MG tablet, Take 10 mg by mouth daily. , Disp: , Rfl:  .  vitamin C (ASCORBIC ACID) 500 MG tablet, Take 500 mg by mouth daily.  , Disp: , Rfl:   Current facility-administered medications:  .  Influenza vac split quadrivalent PF (FLUARIX) injection 0.5 mL, 0.5 mL, Intramuscular, Once, [name], MD  Allergies:  Allergies  Allergen Reactions  . Levaquin [Levofloxacin] Other (See Comments)    Diarrhea/Muscle weakness/inability to lift arms/limbs    Past Medical History, Surgical history, Social history, and Family History were reviewed and updated.  Review of Systems: As above  Physical Exam:  height is 5\' 7"  (1.702 m) and weight is 196 lb (88.905 kg). Her oral temperature is 98 F (36.7 C). Her blood pressure is 138/66 and her pulse is 60. Her respiration is 16.   Wt Readings from Last 3 Encounters:  02/26/15 196 lb (88.905 kg)  08/28/14 212 lb (96.163 kg)  02/27/14 222 lb (100.699 kg)     Well-developed well-nourished white female in no obvious distress. Head and neck exam shows no ocular or oral lesions. There are no palpable cervical or supraclavicular lymph nodes. Lungs are clear.  Cardiac exam regular rate and rhythm with no murmurs, rubs or bruits. Abdomen is soft. She has good bowel sounds. There is a palpable abdominal mass. Is no fluid wave. There is a palpable liver or spleen tip. Axillary exam shows no bilateral axillary adenopathy. Extremities shows no clubbing, cyanosis or edema. She has a well-healed wide local excision scar on the right upper arm. This is in the back of the distal portion of the right upper arm. Skin exam shows no suspicious hyperpigmented lesions.  Lab Results  Component Value Date   WBC 7.6 02/26/2015   HGB 15.7 02/26/2015   HCT 46.6 02/26/2015   MCV 90 02/26/2015   PLT 274 02/26/2015      Chemistry      Component Value Date/Time   NA 139 08/28/2014 0800   NA 142 02/27/2014 0833   K 4.3 08/28/2014 0800   K 3.9 02/27/2014 0833   CL 104 08/28/2014 0800   CL 100 02/27/2014 0833   CO2 26 08/28/2014 0800   CO2 26 02/27/2014 0833   BUN 21 08/28/2014 0800   BUN 18 02/27/2014 0833   CREATININE 0.83 08/28/2014 0800   CREATININE 1.0 02/27/2014 0833      Component Value Date/Time   CALCIUM 9.4 08/28/2014 0800   CALCIUM 8.8 02/27/2014 0833   ALKPHOS 87 08/28/2014 0800   ALKPHOS 64 02/27/2014 0833   AST 18 08/28/2014 0800   AST 24 02/27/2014 0833   ALT 25 08/28/2014 0800   ALT 26 02/27/2014 0833   BILITOT 1.5* 08/28/2014 0800   BILITOT 1.10 02/27/2014 0833         Impression and Plan: Sara Bryan is 74 year old white female with a resected  stage IB melanoma of the right arm. She had resection back in November 2012.   I do not see any evidence of recurrence or a second melanoma.  We will continue to follow her along every year  She lost some weight. This will be helpful.   [name], MD 9/19/20168:45 AM

## 2015-04-13 ENCOUNTER — Encounter: Payer: Self-pay | Admitting: Surgery

## 2015-04-13 ENCOUNTER — Other Ambulatory Visit: Payer: Self-pay | Admitting: *Deleted

## 2015-04-13 DIAGNOSIS — I8312 Varicose veins of left lower extremity with inflammation: Secondary | ICD-10-CM

## 2015-05-15 ENCOUNTER — Encounter: Payer: Self-pay | Admitting: Surgery

## 2015-05-21 ENCOUNTER — Ambulatory Visit (INDEPENDENT_AMBULATORY_CARE_PROVIDER_SITE_OTHER): Payer: Medicare Other | Admitting: Surgery

## 2015-05-21 ENCOUNTER — Encounter: Payer: Self-pay | Admitting: Surgery

## 2015-05-21 ENCOUNTER — Ambulatory Visit (HOSPITAL_COMMUNITY)
Admission: RE | Admit: 2015-05-21 | Discharge: 2015-05-21 | Disposition: A | Discharge status: Home / Self Care | Payer: Medicare Other | Source: Ambulatory Visit | Attending: Surgery | Admitting: Surgery

## 2015-05-21 VITALS — BP 128/78 | HR 71 | Resp 14 | Ht 66.6 in | Wt 198.0 lb

## 2015-05-21 DIAGNOSIS — I872 Venous insufficiency (chronic) (peripheral): Secondary | ICD-10-CM | POA: Diagnosis not present

## 2015-05-21 DIAGNOSIS — I8312 Varicose veins of left lower extremity with inflammation: Secondary | ICD-10-CM | POA: Diagnosis present

## 2015-05-21 DIAGNOSIS — E785 Hyperlipidemia, unspecified: Secondary | ICD-10-CM | POA: Diagnosis not present

## 2015-05-21 NOTE — Progress Notes (Signed)
Patient name: Sara Bryan MRN: [id number] DOB: May 14, 1941 Sex: female   Referred by: Dr. Allyson Sabal  Reason for referral:  Chief Complaint  Patient presents with  . New Evaluation    varicose vein left leg    HISTORY OF PRESENT ILLNESS: This is a 74 year old female who comes in today for evaluation of swelling in her left leg.  This has been present for a while.  It has gotten worse after resection of a melanoma lesion.  She is on her feet most of the day as she is still working.  She does have swelling which is worse with standing up.  She does not work compression stockings.  She has not had an open wound.  The patient suffers some hypercholesterolemia which is managed with a statin.  She has no significant tobacco abuse.  Past Medical History  Diagnosis Date  . Hernia   . Hyperlipidemia   . Supraventricular tachycardia ([place]) 2007    once- cardiologist- dr Acie Fredrickson; yearly  . Cancer ([place])     Melonoma  . Varicose veins     Past Surgical History  Procedure Laterality Date  . Cesarean section    . Melanoma excision      from back in 2007  . Hernia repair  1975    abd wall  . Hernia repair    . Hernia repair    . Hernia repair      Social History   Social History  . Marital Status: Widowed    Spouse Name: N/A  . Number of Children: N/A  . Years of Education: N/A   Occupational History  . Not on file.   Social History Main Topics  . Smoking status: Former Smoker -- 0.50 packs/day for 20 years    Types: Cigarettes    Start date: 08/30/1959    Quit date: 01/23/1980  . Smokeless tobacco: Never Used     Comment: quit 33 years ago  . Alcohol Use: No  . Drug Use: No  . Sexual Activity: No   Other Topics Concern  . Not on file   Social History Narrative    Family History  Problem Relation Age of Onset  . Cancer Mother     Breast  . Arthritis Father     RA  . Heart disease Father     cardiomegaly    Allergies as of 05/21/2015 - Review  Complete 05/21/2015  Allergen Reaction Noted  . Levaquin [levofloxacin] Other (See Comments) 02/27/2014    Current Outpatient Prescriptions on File Prior to Visit  Medication Sig Dispense Refill  . aspirin EC 81 MG tablet Take 81 mg by mouth daily.    Marland Kitchen b complex vitamins tablet Take 1 tablet by mouth daily.      Marland Kitchen CALCIUM PO Take 1,000 mg by mouth daily.     . CHOLECALCIFEROL PO Take 6,000 Units by mouth daily.    . Coenzyme Q10 (CO Q-10 PO) Take 100 mg by mouth daily.     . fish oil-omega-3 fatty acids 1000 MG capsule Take 1 g by mouth daily.      Marland Kitchen levothyroxine (SYNTHROID) 50 MCG tablet Take 50 mcg by mouth daily.    . Multiple Vitamins-Minerals (MULTIVITAMINS THER. W/MINERALS) TABS Take 1 tablet by mouth daily.      . Probiotic Product (PROBIOTIC DAILY PO) Take by mouth every morning.    . rosuvastatin (CRESTOR) 10 MG tablet Take 10 mg by mouth daily.     Marland Kitchen  vitamin C (ASCORBIC ACID) 500 MG tablet Take 500 mg by mouth daily.      Marland Kitchen HYDROcodone-acetaminophen (NORCO/VICODIN) 5-325 MG per tablet Take 1-2 tablets by mouth every 6 (six) hours as needed for moderate pain or severe pain. (Patient not taking: Reported on 05/21/2015) 10 tablet 0  . ondansetron (ZOFRAN ODT) 8 MG disintegrating tablet Take 1 tablet (8 mg total) by mouth every 8 (eight) hours as needed for nausea or vomiting. (Patient not taking: Reported on 05/21/2015) 20 tablet 0   No current facility-administered medications on file prior to visit.     REVIEW OF SYSTEMS: Cardiovascular: No chest pain, chest pressure, palpitations, orthopnea, or dyspnea on exertion. No claudication or rest pain,  positive for leg swelling Pulmonary: No productive cough, asthma or wheezing. Neurologic: No weakness, paresthesias, aphasia, or amaurosis. No dizziness. Hematologic: No bleeding problems or clotting disorders. Musculoskeletal: No joint pain or joint swelling. Gastrointestinal: No blood in stool or hematemesis Genitourinary: No  dysuria or hematuria. Psychiatric:: No history of major depression. Integumentary: No rashes or ulcers. Constitutional: No fever or chills.  PHYSICAL EXAMINATION:  Filed Vitals:   05/21/15 1246  BP: 128/78  Pulse: 71  Resp: 14  Height: 5' 6.6" (1.692 m)  Weight: 198 lb (89.812 kg)  SpO2: 96%   Body mass index is 31.37 kg/(m^2). General: The patient appears their stated age.   HEENT:  No gross abnormalities Pulmonary: Respirations are non-labored  Musculoskeletal: There are no major deformities.   Neurologic: No focal weakness or paresthesias are detected, Skin: There are no ulcer or rashes noted. Psychiatric: The patient has normal affect. Cardiovascular: 1-2 plus pitting edema left leg.  Surrounding hyperpigmentation in the medial side of the leg.  No open wounds.  Diagnostic Studies: I have reviewed her reflux evaluation.  There is no evidence of DVT.  There is no significant deep vein reflux.  He has no reflux within the great saphenous vein.  There is a short section of reflux and a rather small 0.22 mm small saphenous vein.  There is also an incompetent perforator.    Assessment:  Left leg swelling Plan: I suspect the etiology of her swelling is multifactorial.  There is likely a small venous component as well as a lymphatic component.  Given the small section of reflux and the short saphenous vein, I do not think this warrants intervention.  She does have a perforator, however I discussed the options for treating this as well as the long-term results.  I think before even considering this she should see how she does with compression stockings.  She has been measured and will purchase these.  She will contact me if she continues to have difficulty.     Eldridge Abrahams, M.D. [facility] Office: [phone] Pager:  [phone]

## 2016-02-20 ENCOUNTER — Emergency Department (HOSPITAL_COMMUNITY)
Admission: EM | Admit: 2016-02-20 | Discharge: 2016-02-20 | Disposition: A | Discharge status: Home / Self Care | Payer: Medicare Other | Attending: Emergency Medicine | Admitting: Emergency Medicine

## 2016-02-20 ENCOUNTER — Encounter (HOSPITAL_COMMUNITY): Payer: Self-pay

## 2016-02-20 ENCOUNTER — Emergency Department (HOSPITAL_COMMUNITY): Payer: Medicare Other

## 2016-02-20 DIAGNOSIS — Z87891 Personal history of nicotine dependence: Secondary | ICD-10-CM | POA: Diagnosis not present

## 2016-02-20 DIAGNOSIS — Z7982 Long term (current) use of aspirin: Secondary | ICD-10-CM | POA: Insufficient documentation

## 2016-02-20 DIAGNOSIS — Z79899 Other long term (current) drug therapy: Secondary | ICD-10-CM | POA: Diagnosis not present

## 2016-02-20 DIAGNOSIS — Z85820 Personal history of malignant melanoma of skin: Secondary | ICD-10-CM | POA: Insufficient documentation

## 2016-02-20 DIAGNOSIS — R0789 Other chest pain: Secondary | ICD-10-CM | POA: Diagnosis not present

## 2016-02-20 DIAGNOSIS — E039 Hypothyroidism, unspecified: Secondary | ICD-10-CM | POA: Diagnosis not present

## 2016-02-20 DIAGNOSIS — R079 Chest pain, unspecified: Secondary | ICD-10-CM | POA: Diagnosis present

## 2016-02-20 LAB — BASIC METABOLIC PANEL
Anion gap: 10 (ref 5–15)
BUN: 21 mg/dL — ABNORMAL HIGH (ref 6–20)
CHLORIDE: 102 mmol/L (ref 101–111)
CO2: 26 mmol/L (ref 22–32)
Calcium: 9.6 mg/dL (ref 8.9–10.3)
Creatinine, Ser: 0.84 mg/dL (ref 0.44–1.00)
GFR calc non Af Amer: >60 mL/min (ref >60)
Glucose, Bld: 98 mg/dL (ref 65–99)
POTASSIUM: 4 mmol/L (ref 3.5–5.1)
SODIUM: 138 mmol/L (ref 135–145)

## 2016-02-20 LAB — CBC
HEMATOCRIT: 47.2 % — AB (ref 36.0–46.0)
HEMOGLOBIN: 15.4 g/dL — AB (ref 12.0–15.0)
MCH: 30 pg (ref 26.0–34.0)
MCHC: 32.6 g/dL (ref 30.0–36.0)
MCV: 91.8 fL (ref 78.0–100.0)
Platelets: 298 10*3/uL (ref 150–400)
RBC: 5.14 MIL/uL — AB (ref 3.87–5.11)
RDW: 13.1 % (ref 11.5–15.5)
WBC: 11.1 10*3/uL — AB (ref 4.0–10.5)

## 2016-02-20 LAB — I-STAT TROPONIN, ED: Troponin i, poc: 0 ng/mL (ref 0.00–0.08)

## 2016-02-20 LAB — HEPATIC FUNCTION PANEL
ALBUMIN: 3.7 g/dL (ref 3.5–5.0)
ALT: 27 U/L (ref 14–54)
AST: 39 U/L (ref 15–41)
Alkaline Phosphatase: 70 U/L (ref 38–126)
BILIRUBIN INDIRECT: 1.8 mg/dL — AB (ref 0.3–0.9)
Bilirubin, Direct: 0.3 mg/dL (ref 0.1–0.5)
TOTAL PROTEIN: 7.5 g/dL (ref 6.5–8.1)
Total Bilirubin: 2.1 mg/dL — ABNORMAL HIGH (ref 0.3–1.2)

## 2016-02-20 LAB — D-DIMER, QUANTITATIVE (NOT AT ARMC): D DIMER QUANT: 0.53 ug{FEU}/mL — AB (ref 0.00–0.50)

## 2016-02-20 MED ORDER — IOPAMIDOL (ISOVUE-370) INJECTION 76%
100.0000 mL | Freq: Once | INTRAVENOUS | Status: AC | PRN
  Administered 2016-02-20: 100 mL via INTRAVENOUS

## 2016-02-20 NOTE — ED Triage Notes (Signed)
Pt BIB [facility] for evaluation of central CP across breasts with radiation to back and neck. Pt. Reports intermittent this afternoon with "waves of sharpness and tightness." Pt. Denies associated symptoms. Denies SOB. Pt. States only hx hyperlipidemia and hypothyroidism. VSS. AxO x4, given 324 ASA.

## 2016-02-20 NOTE — ED Notes (Signed)
Patient transported to CT 

## 2016-02-20 NOTE — ED Provider Notes (Signed)
[facility] DEPT Provider Note   CSN: [id number] Arrival date & time: 02/20/16  1713     History   Chief Complaint Chief Complaint  Patient presents with  . Chest Pain    HPI Sara Bryan is a 75 y.o. female.  HPI  Patient is a 75 year old female with a past medical history of hypothyroidism and hyperlipidemia who comes in today complaining of circumferential chest and back pain.  Patient indicates that the pain is located just at the costal margin.  Patient also endorses some discomfort in the neck but denies radiation to the left arm.  Patient denies shortness of breath, lightheadedness or dizziness, nausea or vomiting.  Patient describes the pain as "waves of shortness and tightness."  Patient states the pain is not made better or worse by anything.   Past Medical History:  Diagnosis Date  . Cancer ([place])    Melonoma  . Hernia   . Hyperlipidemia   . Supraventricular tachycardia ([place]) 2007   once- cardiologist- dr Acie Fredrickson; yearly  . Varicose veins     Patient Active Problem List   Diagnosis Date Noted  . 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 SVT (supraventricular tachycardia) ([place]) 01/28/2011  . HYPERLIPIDEMIA 05/20/2007    Past Surgical History:  Procedure Laterality Date  . CESAREAN SECTION    . HERNIA REPAIR  1975   abd wall  . HERNIA REPAIR    . HERNIA REPAIR    . HERNIA REPAIR    . MELANOMA EXCISION     from back in 2007    OB History    No data available       Home Medications    Prior to Admission medications   Medication Sig Start Date End Date Taking? Authorizing Provider  aspirin EC 81 MG tablet Take 81 mg by mouth daily.   Yes Historical Provider, MD  b complex vitamins tablet Take 1 tablet by mouth daily.     Yes Historical Provider, MD  CALCIUM PO Take 1,000 mg by mouth daily.    Yes Historical Provider, MD   CHOLECALCIFEROL PO Take 6,000 Units by mouth daily.   Yes Historical Provider, MD  Coenzyme Q10 (CO Q-10 PO) Take 100 mg by mouth daily.    Yes Historical Provider, MD  fish oil-omega-3 fatty acids 1000 MG capsule Take 1 g by mouth daily.     Yes Historical Provider, MD  levothyroxine (SYNTHROID) 50 MCG tablet Take 50 mcg by mouth daily.   Yes Historical Provider, MD  Multiple Vitamins-Minerals (MULTIVITAMINS THER. W/MINERALS) TABS Take 1 tablet by mouth daily.     Yes Historical Provider, MD  Probiotic Product (PROBIOTIC DAILY PO) Take by mouth every morning.   Yes Historical Provider, MD  rosuvastatin (CRESTOR) 10 MG tablet Take 10 mg by mouth daily.    Yes Historical Provider, MD  vitamin C (ASCORBIC ACID) 500 MG tablet Take 500 mg by mouth daily.     Yes Historical Provider, MD  HYDROcodone-acetaminophen (NORCO/VICODIN) 5-325 MG per tablet Take 1-2 tablets by mouth every 6 (six) hours as needed for moderate pain or severe pain. Patient not taking: Reported on 05/21/2015 08/15/14   Linton Flemings, MD  ondansetron (ZOFRAN ODT) 8 MG disintegrating tablet Take 1 tablet (8 mg total) by mouth every 8 (eight) hours as needed for nausea or vomiting. Patient not taking: Reported on 05/21/2015 08/15/14  Linton Flemings, MD    Family History Family History  Problem Relation Age of Onset  . Cancer Mother     Breast  . Arthritis Father     RA  . Heart disease Father     cardiomegaly    Social History Social History  Substance Use Topics  . Smoking status: Former Smoker    Packs/day: 0.50    Years: 20.00    Types: Cigarettes    Start date: 08/30/1959    Quit date: 01/23/1980  . Smokeless tobacco: Never Used     Comment: quit 33 years ago  . Alcohol use No     Allergies   Levaquin [levofloxacin]   Review of Systems Review of Systems  Constitutional: Negative for chills, diaphoresis, fatigue and fever.  Respiratory: Positive for chest tightness. Negative for shortness of breath.     Cardiovascular: Positive for chest pain.  Gastrointestinal: Negative for abdominal distention, constipation, diarrhea, nausea and vomiting.  All other systems reviewed and are negative.    Physical Exam Updated Vital Signs BP 137/63   Pulse 70   Temp 97.9 F (36.6 C)   Resp 22   SpO2 96%   Physical Exam  Constitutional: She appears well-developed and well-nourished. No distress.  HENT:  Head: Normocephalic and atraumatic.  Eyes: Conjunctivae are normal.  Neck: Neck supple.  Cardiovascular: Normal rate and regular rhythm.  Exam reveals no gallop and no friction rub.   No murmur heard. Pulmonary/Chest: Effort normal and breath sounds normal. No respiratory distress. She exhibits no tenderness.  Abdominal: Soft. She exhibits no distension. There is no tenderness. There is no guarding.  Musculoskeletal: She exhibits no edema.  Neurological: She is alert.  Skin: Skin is warm and dry.  Psychiatric: She has a normal mood and affect.  Nursing note and vitals reviewed.     ED Treatments / Results  Labs (all labs ordered are listed, but only abnormal results are displayed) Labs Reviewed  BASIC METABOLIC PANEL - Abnormal; Notable for the following:       Result Value   BUN 21 (*)    All other components within normal limits  CBC - Abnormal; Notable for the following:    WBC 11.1 (*)    RBC 5.14 (*)    Hemoglobin 15.4 (*)    HCT 47.2 (*)    All other components within normal limits  D-DIMER, QUANTITATIVE (NOT AT [facility]) - Abnormal; Notable for the following:    D-Dimer, Quant 0.53 (*)    All other components within normal limits  HEPATIC FUNCTION PANEL - Abnormal; Notable for the following:    Total Bilirubin 2.1 (*)    Indirect Bilirubin 1.8 (*)    All other components within normal limits  I-STAT TROPOININ, ED    EKG  EKG Interpretation  Date/Time:  Wednesday February 20 2016 17:20:09 EDT Ventricular Rate:  66 PR Interval:    QRS Duration: 82 QT  Interval:  378 QTC Calculation: 396 R Axis:   40 Text Interpretation:  Sinus rhythm Low voltage, precordial leads Confirmed by Christy Gentles  MD, DONALD ([id number]) on 02/20/2016 5:29:16 PM       Radiology Dg Chest 2 View  Result Date: 02/20/2016 CLINICAL DATA:  Chest pain. EXAM: CHEST  2 VIEW COMPARISON:  Radiographs of August 15, 2014. FINDINGS: The heart size and mediastinal contours are within normal limits. No pneumothorax or pleural effusion is noted. Right lung is clear. Stable scarring is noted in left lung base. The visualized  skeletal structures are unremarkable. IMPRESSION: No active cardiopulmonary disease. Electronically Signed   By: Marijo Conception, M.D.   On: 02/20/2016 18:34   Ct Angio Chest Pe W And/or Wo Contrast  Result Date: 02/20/2016 CLINICAL DATA:  Severe chest pain EXAM: CT ANGIOGRAPHY CHEST WITH CONTRAST TECHNIQUE: Multidetector CT imaging of the chest was performed using the standard protocol during bolus administration of intravenous contrast. Multiplanar CT image reconstructions and MIPs were obtained to evaluate the vascular anatomy. CONTRAST:  80 cc of Isovue 370 COMPARISON:  None. FINDINGS: Cardiovascular: Normal heart size. No pericardial effusion. Aortic atherosclerosis noted. Calcification in the LAD coronary artery noted. The main pulmonary artery is patent. There is no lobar or segmental pulmonary artery filling defects identified to suggest a clinically significant acute pulmonary embolus. Mediastinum/Nodes: The trachea appears patent and is midline. Normal appearance of the esophagus. No mediastinal or hilar adenopathy. Lungs/Pleura: There is no pleural fluid identified. No airspace consolidation. Subsegmental atelectasis versus scar noted in the right lower lobe and lingula. There is a nodular density left upper lobe which measures 9 mm. Previously this nodule measured 3 mm. Upper Abdomen: Multiple stones are identified within the dependent portion of the gallbladder. No  acute findings identified within the upper abdomen. Musculoskeletal: Spondylosis noted within the thoracic spine. No suspicious bone lesions. Review of the MIP images confirms the above findings. IMPRESSION: 1. No evidence for acute pulmonary embolus. 2. Aortic atherosclerosis and coronary artery calcification. 3. **An incidental finding of potential clinical significance has been found. Pulmonary nodule in the left upper lobe measures 9 mm. This may been present on the study from 2007 when it measured 3 mm. Consider one of the following in 3 months for both low-risk and high-risk individuals: (a) repeat chest CT, (b) follow-up PET-CT, or (c) tissue sampling. This recommendation follows the consensus statement: Guidelines for Management of Incidental Pulmonary Nodules Detected on CT Images: From the Fleischner Society 2017; Radiology 2017; 284:228-243.** Electronically Signed   By: Kerby Moors M.D.   On: 02/20/2016 21:56    Procedures Procedures (including critical care time)  Medications Ordered in ED Medications  iopamidol (ISOVUE-370) 76 % injection 100 mL (100 mLs Intravenous Contrast Given 02/20/16 2116)     Initial Impression / Assessment and Plan / ED Course  I have reviewed the triage vital signs and the nursing notes.  Pertinent labs & imaging results that were available during my care of the patient were reviewed by me and considered in my medical decision making (see chart for details).  Clinical Course    Patient is a 75 year old female with a past medical history of hypothyroidism and hyperlipidemia who comes in today complaining of circumferential chest and back pain.  Patient indicates that the pain is located just at the costal margin.  Patient also endorses some discomfort in the neck but denies radiation to the left arm.  Patient denies shortness of breath, lightheadedness or dizziness, nausea or vomiting.  Patient describes the pain as "waves of shortness and tightness."   Patient states the pain is not made better or worse by anything.     physical exam: Patient clear to auscultation bilaterally normal S1-S2 no rubs murmurs gallops abdomen soft nontender.  Remainder physical exam within normal limits.  We will conduct chest pain workup.  We will additionally collect d-dimer given concern for possible PE.  Patient's laboratory workup largely within normal limits.  Patient with negative troponin, and elevated d-dimer.  Will collect CTA of chest for further  evaluation.  Patient's EKG showed low voltage complexes.  Bedside ultrasound was unable to determine presence or absence of pericardial effusion.  Patient currently without signs of tamponade.  CT chest showed no pulmonary embolus or pericardial effusion.  Gallbladder showed large volume of radio dense stones.  Patient states that she initially thought that her pain is likely related to her gallbladder as she has been eating well for many years since being diagnosed with cholelithiasis.  However, patient states last night she had dinner eating mostly fried foods which resulted in the onset of her pain.  Shared decision making with patient: Patient given the option of staying for  observation and further cardiac workup.  Patient declined and states that she will follow up with her PCP.  Patient told that she will likely require cholecystectomy.  Patient states that she will follow-up with a general surgeon whom she is familiar with.  Patient given appropriate follow-up and return precautions.  Patient voiced understanding and is agreeable to discharge this time.   Final Clinical Impressions(s) / ED Diagnoses   Final diagnoses:  Atypical chest pain    New Prescriptions Discharge Medication List as of 02/20/2016 10:42 PM       Chapman Moss, MD 02/21/16 [id number]    Ripley Fraise, MD 02/21/16 2354

## 2016-02-20 NOTE — ED Notes (Signed)
Called main lab to add on hepatic function panel. D-dimer sent down from hold tubes.

## 2016-02-26 ENCOUNTER — Ambulatory Visit (HOSPITAL_BASED_OUTPATIENT_CLINIC_OR_DEPARTMENT_OTHER): Payer: Medicare Other | Admitting: Hematology & Oncology

## 2016-02-26 ENCOUNTER — Encounter: Payer: Self-pay | Admitting: Hematology & Oncology

## 2016-02-26 ENCOUNTER — Other Ambulatory Visit (HOSPITAL_BASED_OUTPATIENT_CLINIC_OR_DEPARTMENT_OTHER): Payer: Medicare Other

## 2016-02-26 VITALS — BP 140/55 | HR 63 | Temp 98.1°F | Resp 18 | Ht 66.6 in | Wt 215.0 lb

## 2016-02-26 DIAGNOSIS — C4361 Malignant melanoma of right upper limb, including shoulder: Secondary | ICD-10-CM

## 2016-02-26 DIAGNOSIS — R911 Solitary pulmonary nodule: Secondary | ICD-10-CM

## 2016-02-26 DIAGNOSIS — Z87891 Personal history of nicotine dependence: Secondary | ICD-10-CM

## 2016-02-26 DIAGNOSIS — Z85820 Personal history of malignant melanoma of skin: Secondary | ICD-10-CM

## 2016-02-26 DIAGNOSIS — Z23 Encounter for immunization: Secondary | ICD-10-CM

## 2016-02-26 LAB — CBC WITH DIFFERENTIAL (CANCER CENTER ONLY)
BASO#: 0 10*3/uL (ref 0.0–0.2)
BASO%: 0.5 % (ref 0.0–2.0)
EOS%: 1.8 % (ref 0.0–7.0)
Eosinophils Absolute: 0.2 10*3/uL (ref 0.0–0.5)
HCT: 45.3 % (ref 34.8–46.6)
HGB: 15.7 g/dL (ref 11.6–15.9)
LYMPH#: 2.4 10*3/uL (ref 0.9–3.3)
LYMPH%: 26.8 % (ref 14.0–48.0)
MCH: 30.6 pg (ref 26.0–34.0)
MCHC: 34.7 g/dL (ref 32.0–36.0)
MCV: 88 fL (ref 81–101)
MONO#: 1 10*3/uL — AB (ref 0.1–0.9)
MONO%: 11.3 % (ref 0.0–13.0)
NEUT#: 5.3 10*3/uL (ref 1.5–6.5)
NEUT%: 59.6 % (ref 39.6–80.0)
PLATELETS: 297 10*3/uL (ref 145–400)
RBC: 5.13 10*6/uL (ref 3.70–5.32)
RDW: 13.2 % (ref 11.1–15.7)
WBC: 8.9 10*3/uL (ref 3.9–10.0)

## 2016-02-26 LAB — LACTATE DEHYDROGENASE: LDH: 211 U/L (ref 125–245)

## 2016-02-26 LAB — COMPREHENSIVE METABOLIC PANEL
ALT: 25 U/L (ref 0–55)
ANION GAP: 10 meq/L (ref 3–11)
AST: 23 U/L (ref 5–34)
Albumin: 3.6 g/dL (ref 3.5–5.0)
Alkaline Phosphatase: 75 U/L (ref 40–150)
BILIRUBIN TOTAL: 1.62 mg/dL — AB (ref 0.20–1.20)
BUN: 18 mg/dL (ref 7.0–26.0)
CHLORIDE: 108 meq/L (ref 98–109)
CO2: 25 meq/L (ref 22–29)
CREATININE: 0.8 mg/dL (ref 0.6–1.1)
Calcium: 9.7 mg/dL (ref 8.4–10.4)
EGFR: 73 mL/min/{1.73_m2} — ABNORMAL LOW (ref >90)
GLUCOSE: 117 mg/dL (ref 70–140)
Potassium: 4.1 mEq/L (ref 3.5–5.1)
SODIUM: 142 meq/L (ref 136–145)
TOTAL PROTEIN: 7.6 g/dL (ref 6.4–8.3)

## 2016-02-26 NOTE — Progress Notes (Signed)
Hematology and Oncology Follow Up Visit  HAYES CARRUTHERS [id number] July 25, 1940 75 y.o. 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 [name] is back for follow-up. We see her every year. In the past year, she's done well. She is losing weight. She is quite happy about this.  The problem We have now is that she had some chest pain a week or so ago. She went to the emergency room. She had a CT angiogram done. There is no pulmonary embolus. However, a nodule in the left lobe was noted. This is been noted for 7-10 years. It was 3 mm. It is now 9 mm.  She does not know to do about this. She saw her family doctor. He referred to pulmonary medicine. She's not yet seen pulmonary medicine.   She has a remote history of tobacco use. She's had no cough. There's no hemoptysis.   There's been no fever. She's had no rashes. She's had no change in bowel or bladder habits.  Overall, her performance status is ECOG 1.     Medications:  Current Outpatient Prescriptions:  .  aspirin EC 81 MG tablet, Take 81 mg by mouth daily., Disp: , Rfl:  .  b complex vitamins tablet, Take 1 tablet by mouth daily.  , Disp: , Rfl:  .  CALCIUM PO, Take 1,000 mg by mouth daily. , Disp: , Rfl:  .  CHOLECALCIFEROL PO, Take 6,000 Units by mouth daily., Disp: , Rfl:  .  Coenzyme Q10 (CO Q-10 PO), Take 100 mg by mouth daily. , Disp: , Rfl:  .  fish oil-omega-3 fatty acids 1000 MG capsule, Take 1 g by mouth daily.  , Disp: , Rfl:  .  HYDROcodone-acetaminophen (NORCO/VICODIN) 5-325 MG per tablet, Take 1-2 tablets by mouth every 6 (six) hours as needed for moderate pain or severe pain., Disp: 10 tablet, Rfl: 0 .  levothyroxine (SYNTHROID) 50 MCG tablet, Take 50 mcg by mouth daily., Disp: , Rfl:  .  Multiple Vitamins-Minerals (MULTIVITAMINS THER. W/MINERALS) TABS, Take 1 tablet by mouth daily.  , Disp: , Rfl:  .  ondansetron  (ZOFRAN ODT) 8 MG disintegrating tablet, Take 1 tablet (8 mg total) by mouth every 8 (eight) hours as needed for nausea or vomiting., Disp: 20 tablet, Rfl: 0 .  Probiotic Product (PROBIOTIC DAILY PO), Take by mouth every morning., Disp: , Rfl:  .  rosuvastatin (CRESTOR) 10 MG tablet, Take 10 mg by mouth daily. , Disp: , Rfl:  .  vitamin C (ASCORBIC ACID) 500 MG tablet, Take 500 mg by mouth daily.  , Disp: , Rfl:   Allergies:  Allergies  Allergen Reactions  . Levaquin [Levofloxacin] Other (See Comments)    Diarrhea/Muscle weakness/inability to lift arms/limbs  . Aspirin Nausea Only and Nausea And Vomiting    Enteric is ok  . Atorvastatin     Other reaction(s): myalgias  . Codeine Nausea Only    Past Medical History, Surgical history, Social history, and Family History were reviewed and updated.  Review of Systems: As above  Physical Exam:  height is 5' 6.6" (1.692 m) and weight is 215 lb (97.5 kg). Her oral temperature is 98.1 F (36.7 C). Her blood pressure is 140/55 (abnormal) and her pulse is 63. Her respiration is 18.   Wt Readings from Last 3 Encounters:  02/26/16 215 lb (97.5 kg)  05/21/15 198 lb (  89.8 kg)  02/26/15 196 lb (88.9 kg)     Well-developed well-nourished white female in no obvious distress. Head and neck exam shows no ocular or oral lesions. There are no palpable cervical or supraclavicular lymph nodes. Lungs are clear. Cardiac exam regular rate and rhythm with no murmurs, rubs or bruits. Abdomen is soft. She has good bowel sounds. There is a palpable abdominal mass. Is no fluid wave. There is a palpable liver or spleen tip. Axillary exam shows no bilateral axillary adenopathy. Extremities shows no clubbing, cyanosis or edema. She has a well-healed wide local excision scar on the right upper arm. This is in the back of the distal portion of the right upper arm. Skin exam shows no suspicious hyperpigmented lesions.  Lab Results  Component Value Date   WBC 8.9  02/26/2016   HGB 15.7 02/26/2016   HCT 45.3 02/26/2016   MCV 88 02/26/2016   PLT 297 02/26/2016     Chemistry      Component Value Date/Time   NA 142 02/26/2016 0921   K 4.1 02/26/2016 0921   CL 102 02/20/2016 1728   CL 105 02/26/2015 0807   CO2 25 02/26/2016 0921   BUN 18.0 02/26/2016 0921   CREATININE 0.8 02/26/2016 0921      Component Value Date/Time   CALCIUM 9.7 02/26/2016 0921   ALKPHOS 75 02/26/2016 0921   AST 23 02/26/2016 0921   ALT 25 02/26/2016 0921   BILITOT 1.62 (H) 02/26/2016 0921         Impression and Plan: [name] is 75 year old white female with a resected  stage IB melanoma of the right arm. She had resection back in November 2012.   Unfortunately, we now have to workup this nodule in the left upper lung. It is hard to say if this is anything that is significant. I would be shocked this was anything related to melanoma. I would be shocked if this is anything malignant.   I think it would be worthwhile and reasonable to do a CT scan of the chest in 3 months. If this continues to grow, then possibly a PET scan would be next.  I talked to her at length about this. I really don't think she has to see pulmonary medicine. I will think they really can't do anything for her right now.   I will see her back in 3 months. We will do the CT scan today that I see her.   I spent about 30 minutes with her today. I was not expecting this lung nodule to be an issue. However, we will certainly proceed with evaluation.   [name], MD 9/19/20174:23 PM

## 2016-03-06 ENCOUNTER — Other Ambulatory Visit: Payer: Self-pay | Admitting: General Surgery

## 2016-03-14 ENCOUNTER — Encounter: Payer: Self-pay | Admitting: Pulmonary Disease

## 2016-03-14 ENCOUNTER — Ambulatory Visit (INDEPENDENT_AMBULATORY_CARE_PROVIDER_SITE_OTHER): Payer: Medicare Other | Admitting: Pulmonary Disease

## 2016-03-14 VITALS — BP 134/76 | HR 72 | Ht 65.6 in | Wt 217.4 lb

## 2016-03-14 DIAGNOSIS — R911 Solitary pulmonary nodule: Secondary | ICD-10-CM | POA: Diagnosis not present

## 2016-03-14 NOTE — Patient Instructions (Signed)
We reviewed the CT scans. We will cancel your follow-up CT scan and instead ordered a PET scan to be done in December 2017.  Follow up in clinic after the scan for review

## 2016-03-14 NOTE — Pre-Procedure Instructions (Signed)
    KEELIN ZITTEL  03/14/2016      [id number] [address], [place] [address] [address] Phone: [phone] Fax: [phone]    Your procedure is scheduled on 03/19/16.  Report to [facility] Admitting at 8 A.M.  Call this number if you have problems the morning of surgery:  [phone]   Remember:  Do not eat food or drink liquids after midnight.  Take these medicines the morning of surgery with A SIP OF WATER --tylenol,synthroid,hyrdocodone   Do not wear jewelry, make-up or nail polish.  Do not wear lotions, powders, or perfumes, or deoderant.  Do not shave 48 hours prior to surgery.  Men may shave face and neck.  Do not bring valuables to the hospital.  [facility] is not responsible for any belongings or valuables.  Contacts, dentures or bridgework may not be worn into surgery.  Leave your suitcase in the car.  After surgery it may be brought to your room.  For patients admitted to the hospital, discharge time will be determined by your treatment team.  Patients discharged the day of surgery will not be allowed to drive home.   Name and phone number of your driver:   Special instructions:  Do not take any aspirin,anti-inflammatories,vitamins,or herbal supplements 5-7 days prior to surgery.  Please read over the following fact sheets that you were given.

## 2016-03-14 NOTE — Progress Notes (Signed)
Sara Bryan    XU:[id number]    03/17/1941  Primary Care Physician:SWAYNE,DAVID W, MD  Referring Physician: Antony Contras, MD [address], [place] [address]  Chief complaint:  Consult for evaluation of abnormal CT scan  HPI: Sara Bryan is a 75 year old with past medical history of melanoma status post resection. She has been followed by Dr. Marin Olp with no evidence of recurrence. She had a CT chest to evaluate chest pain and was found to have a left upper lobe nodule. This was seen earlier in 2007 at 3 mm and now is 9 mm. She also has a stable left lower lobe calcified nodule. She denies any primary symptoms of cough, sputum production, fevers, chills, hemoptysis, loss of weight, loss of appetite.   Outpatient Encounter Prescriptions as of 03/14/2016  Medication Sig  . acetaminophen (TYLENOL) 500 MG tablet Take 500 mg by mouth every 6 (six) hours as needed for mild pain.  Marland Kitchen aspirin EC 81 MG tablet Take 81 mg by mouth daily.  Marland Kitchen b complex vitamins tablet Take 1 tablet by mouth daily.    Marland Kitchen CALCIUM PO Take 1,000 mg by mouth daily.   . cetirizine (ZYRTEC) 10 MG tablet Take 10 mg by mouth daily as needed for allergies.  . CHOLECALCIFEROL PO Take 6,000 Units by mouth daily.  . Coenzyme Q10 (CO Q-10 PO) Take 100 mg by mouth daily.   . fish oil-omega-3 fatty acids 1000 MG capsule Take 1 g by mouth daily.    Marland Kitchen levothyroxine (SYNTHROID) 50 MCG tablet Take 50 mcg by mouth daily.  . Multiple Vitamins-Minerals (MULTIVITAMINS THER. W/MINERALS) TABS Take 1 tablet by mouth daily.    . Probiotic Product (PROBIOTIC DAILY PO) Take 1 tablet by mouth daily.   . rosuvastatin (CRESTOR) 10 MG tablet Take 10 mg by mouth daily.   . vitamin C (ASCORBIC ACID) 500 MG tablet Take 500 mg by mouth daily.    Marland Kitchen HYDROcodone-acetaminophen (NORCO/VICODIN) 5-325 MG per tablet Take 1-2 tablets by mouth every 6 (six) hours as needed for moderate pain or severe pain. (Patient not taking:  Reported on 03/14/2016)  . ondansetron (ZOFRAN ODT) 8 MG disintegrating tablet Take 1 tablet (8 mg total) by mouth every 8 (eight) hours as needed for nausea or vomiting. (Patient not taking: Reported on 03/14/2016)   No facility-administered encounter medications on file as of 03/14/2016.     Allergies as of 03/14/2016 - Review Complete 03/14/2016  Allergen Reaction Noted  . Levaquin [levofloxacin] Other (See Comments) 02/27/2014  . Aspirin Nausea Only and Nausea And Vomiting 01/23/2011  . Atorvastatin  12/10/2015  . Codeine Nausea Only 12/10/2015    Past Medical History:  Diagnosis Date  . Cancer ([place])    Melonoma  . Hernia   . Hyperlipidemia   . Supraventricular tachycardia ([place]) 2007   once- cardiologist- dr Acie Fredrickson; yearly  . Varicose veins     Past Surgical History:  Procedure Laterality Date  . CESAREAN SECTION    . HERNIA REPAIR  1975   abd wall  . HERNIA REPAIR    . HERNIA REPAIR    . HERNIA REPAIR    . MELANOMA EXCISION     from back in 2007    Family History  Problem Relation Age of Onset  . Cancer Mother     Breast  . Arthritis Father     RA  . Heart disease Father     cardiomegaly  Social History   Social History  . Marital status: Widowed    Spouse name: N/A  . Number of children: N/A  . Years of education: N/A   Occupational History  . Not on file.   Social History Main Topics  . Smoking status: Former Smoker    Packs/day: 0.50    Years: 20.00    Types: Cigarettes    Start date: 08/30/1959    Quit date: 03/15/1991  . Smokeless tobacco: Never Used     Comment: quit 33 years ago  . Alcohol use No  . Drug use: No  . Sexual activity: No   Other Topics Concern  . Not on file   Social History Narrative   Widowed   Children:2   Lives alone   Works at the [employer]     Review of systems: Review of Systems  Constitutional: Negative for fever and chills.  HENT: Negative.   Eyes: Negative for blurred vision.  Respiratory: as  per HPI  Cardiovascular: Negative for chest pain and palpitations.  Gastrointestinal: Negative for vomiting, diarrhea, blood per rectum. Genitourinary: Negative for dysuria, urgency, frequency and hematuria.  Musculoskeletal: Negative for myalgias, back pain and joint pain.  Skin: Negative for itching and rash.  Neurological: Negative for dizziness, tremors, focal weakness, seizures and loss of consciousness.  Endo/Heme/Allergies: Negative for environmental allergies.  Psychiatric/Behavioral: Negative for depression, suicidal ideas and hallucinations.  All other systems reviewed and are negative.   Physical Exam: Blood pressure 134/76, pulse 72, height 5' 5.6" (1.666 m), weight 217 lb 6.4 oz (98.6 kg), SpO2 97 %. Gen:      No acute distress HEENT:  EOMI, sclera anicteric Neck:     No masses; no thyromegaly Lungs:    Clear to auscultation bilaterally; normal respiratory effort CV:         Regular rate and rhythm; no murmurs Abd:      + bowel sounds; soft, non-tender; no palpable masses, no distension Ext:    No edema; adequate peripheral perfusion Skin:      Warm and dry; no rash Neuro: alert and oriented x 3 Psych: normal mood and affect  Data Reviewed: CT scan 02/20/16- RUL nodule 9 mmm. RLL stable calcified nodule CT scan 05/19/06- Right upper lobe 3 mm nodule, right lower lobe nodule.  Assessment:  Eval for lung nodule. CT scan images from 2007 and 2017 reviewed. The left upper lobe nodule has grown slightly from 3 to 9 mm in 10 years. This is unlikely to be a malignancy as the rate of growth is very slow. I would favor a diagnosis of benign nodule.  She has a follow-up CT already ordered for December 2017. I'll change this to a PET scan as the nodule is above the lower limit of detection for the PET and this may give [place] additional information.  Plan/Recommendations: - Follow up PET CT in Dec 2017  Marshell Garfinkel MD [facility] Pager [phone] 03/14/2016, 3:07 PM  CC: Antony Contras, MD

## 2016-03-17 ENCOUNTER — Encounter (HOSPITAL_COMMUNITY): Payer: Self-pay

## 2016-03-17 ENCOUNTER — Encounter (HOSPITAL_COMMUNITY)
Admission: RE | Admit: 2016-03-17 | Discharge: 2016-03-17 | Disposition: A | Discharge status: Home / Self Care | Payer: Medicare Other | Source: Ambulatory Visit | Attending: General Surgery | Admitting: General Surgery

## 2016-03-17 DIAGNOSIS — Z6834 Body mass index (BMI) 34.0-34.9, adult: Secondary | ICD-10-CM | POA: Diagnosis not present

## 2016-03-17 DIAGNOSIS — E669 Obesity, unspecified: Secondary | ICD-10-CM | POA: Diagnosis not present

## 2016-03-17 DIAGNOSIS — E7800 Pure hypercholesterolemia, unspecified: Secondary | ICD-10-CM | POA: Diagnosis not present

## 2016-03-17 DIAGNOSIS — Z888 Allergy status to other drugs, medicaments and biological substances status: Secondary | ICD-10-CM | POA: Diagnosis not present

## 2016-03-17 DIAGNOSIS — K8010 Calculus of gallbladder with chronic cholecystitis without obstruction: Secondary | ICD-10-CM | POA: Diagnosis not present

## 2016-03-17 DIAGNOSIS — E039 Hypothyroidism, unspecified: Secondary | ICD-10-CM | POA: Diagnosis not present

## 2016-03-17 DIAGNOSIS — K828 Other specified diseases of gallbladder: Secondary | ICD-10-CM | POA: Diagnosis not present

## 2016-03-17 DIAGNOSIS — M1990 Unspecified osteoarthritis, unspecified site: Secondary | ICD-10-CM | POA: Diagnosis not present

## 2016-03-17 DIAGNOSIS — Z87891 Personal history of nicotine dependence: Secondary | ICD-10-CM | POA: Diagnosis not present

## 2016-03-17 DIAGNOSIS — Z808 Family history of malignant neoplasm of other organs or systems: Secondary | ICD-10-CM | POA: Diagnosis not present

## 2016-03-17 DIAGNOSIS — K449 Diaphragmatic hernia without obstruction or gangrene: Secondary | ICD-10-CM | POA: Diagnosis not present

## 2016-03-17 DIAGNOSIS — Z85820 Personal history of malignant melanoma of skin: Secondary | ICD-10-CM | POA: Diagnosis not present

## 2016-03-17 DIAGNOSIS — Z885 Allergy status to narcotic agent status: Secondary | ICD-10-CM | POA: Diagnosis not present

## 2016-03-17 DIAGNOSIS — Z7982 Long term (current) use of aspirin: Secondary | ICD-10-CM | POA: Diagnosis not present

## 2016-03-17 DIAGNOSIS — Z803 Family history of malignant neoplasm of breast: Secondary | ICD-10-CM | POA: Diagnosis not present

## 2016-03-17 DIAGNOSIS — Z8261 Family history of arthritis: Secondary | ICD-10-CM | POA: Diagnosis not present

## 2016-03-17 DIAGNOSIS — Z79899 Other long term (current) drug therapy: Secondary | ICD-10-CM | POA: Diagnosis not present

## 2016-03-17 HISTORY — DX: Unspecified osteoarthritis, unspecified site: M19.90

## 2016-03-17 HISTORY — DX: Adverse effect of unspecified anesthetic, initial encounter: T41.45XA

## 2016-03-17 HISTORY — DX: Other complications of anesthesia, initial encounter: T88.59XA

## 2016-03-17 HISTORY — DX: Personal history of other diseases of the digestive system: Z87.19

## 2016-03-17 LAB — COMPREHENSIVE METABOLIC PANEL
ALK PHOS: 63 U/L (ref 38–126)
ALT: 27 U/L (ref 14–54)
ANION GAP: 7 (ref 5–15)
AST: 30 U/L (ref 15–41)
Albumin: 3.5 g/dL (ref 3.5–5.0)
BILIRUBIN TOTAL: 1.5 mg/dL — AB (ref 0.3–1.2)
BUN: 10 mg/dL (ref 6–20)
CALCIUM: 9.4 mg/dL (ref 8.9–10.3)
CO2: 28 mmol/L (ref 22–32)
Chloride: 108 mmol/L (ref 101–111)
Creatinine, Ser: 0.78 mg/dL (ref 0.44–1.00)
GFR calc non Af Amer: >60 mL/min (ref >60)
Glucose, Bld: 100 mg/dL — ABNORMAL HIGH (ref 65–99)
POTASSIUM: 4.2 mmol/L (ref 3.5–5.1)
SODIUM: 143 mmol/L (ref 135–145)
TOTAL PROTEIN: 6.9 g/dL (ref 6.5–8.1)

## 2016-03-17 LAB — CBC WITH DIFFERENTIAL/PLATELET
Basophils Absolute: 0 10*3/uL (ref 0.0–0.1)
Basophils Relative: 0 %
EOS ABS: 0.3 10*3/uL (ref 0.0–0.7)
Eosinophils Relative: 3 %
HEMATOCRIT: 46.4 % — AB (ref 36.0–46.0)
HEMOGLOBIN: 15 g/dL (ref 12.0–15.0)
LYMPHS ABS: 2.5 10*3/uL (ref 0.7–4.0)
Lymphocytes Relative: 28 %
MCH: 29.8 pg (ref 26.0–34.0)
MCHC: 32.3 g/dL (ref 30.0–36.0)
MCV: 92.2 fL (ref 78.0–100.0)
MONO ABS: 0.8 10*3/uL (ref 0.1–1.0)
MONOS PCT: 9 %
NEUTROS PCT: 60 %
Neutro Abs: 5.3 10*3/uL (ref 1.7–7.7)
Platelets: 265 10*3/uL (ref 150–400)
RBC: 5.03 MIL/uL (ref 3.87–5.11)
RDW: 13.2 % (ref 11.5–15.5)
WBC: 8.9 10*3/uL (ref 4.0–10.5)

## 2016-03-17 LAB — LIPASE, BLOOD: LIPASE: 31 U/L (ref 11–51)

## 2016-03-17 MED ORDER — CHLORHEXIDINE GLUCONATE CLOTH 2 % EX PADS: 6.0000 | MEDICATED_PAD | Freq: Once | CUTANEOUS | Status: DC

## 2016-03-17 NOTE — H&P (Signed)
Sara Bryan Location: [facility] Patient #: [id number] DOB: Apr 26, 1941 Widowed / Language: Sara Bryan / Race: White Female        History of Present Illness  The patient is a 75 year old female who presents for evaluation of gall stones. This is a very pleasant 75 year old Caucasian female but I have known for many years. She was referred by Dr. Antony Contras for evaluation of symptomatic gallstones  On August 25, 2014 , one year ago, she presented to the emergency room with upper abdominal pain. She was noted have elevated AST and ALT. Ultrasound showed gallstones but no complications. She was advised to have surgery but declined at that time. She's had a couple of other milder attacks. More recently she had a more severe attack of epigastric pain to the back. CT angiogram chest showed no aneurysm, no pulmonary embolism. Tiny pulmonary nodule was noted to have enlarged and Dr. Marin Olp is following her for this and plans three-month CT follow-up Her episodes of pain are sometimes related to meals. One particular after eating some hot dogs. Sometimes occurs at night after supper. Denies nausea or vomiting. Asymptomatic today. No history of pancreatitis or jaundice.  Comorbidities include to early melanoma resections by me. No recurrence. Followed by Dr. Allyson Sabal and Dr. Marin Olp. 2 C-sections. 3 ventral hernia repairs through lower midline incision. Almost certainly mesh in place. No recurrence of hernia. No upper abdominal surgery. Mother died of breast cancer. Father had rheumatoid arthritis Socially she is a widow and has 2 children. Works full-time at [employer] and answers. Denies tobacco. Former smoker.  I offered to proceed with cholecystectomy without further imaging workup. She would like to do that. I discussed the indications, details, techniques, and numerous risk of laparoscopic cholecystectomy with cholangiogram. She understands all of these  issues. All of her questions are answered. She is aware of the risk of bleeding, infection, conversion to open laparotomy, bile leak, hernia, injury to adjacent organs, and other unforeseen problems. She is ready to schedule and we will proceed with scheduling   Other Problems  Hypercholesterolemia Melanoma Ventral Hernia Repair  Past Surgical History  Cesarean Section - Multiple Oral Surgery Sentinel Lymph Node Biopsy Ventral / Umbilical Hernia Surgery multiple  Diagnostic Studies History  Mammogram 1-3 years ago  Allergies  Atorvastatin Calcium *ANTIHYPERLIPIDEMICS* Codeine Sulfate *ANALGESICS - OPIOID*  Medication History  Aspirin EC (81MG  Tablet DR, Oral) Active. Levothyroxine Sodium (50MCG Tablet, Oral) Active. Ondansetron (8MG  Tablet Disint, Oral) Active. Crestor (10MG  Tablet, Oral) Active. Vitamin C (500MG  Capsule, Oral) Active. Vitamin D (Cholecalciferol) (1000UNIT Tablet, Oral) Active. Vitamin B Complex (Oral) Active. Co Q10 (200MG  Capsule, Oral) Active. Fish Oil (500MG  Capsule, Oral) Active. Calcium (500MG  Tablet, Oral) Active. Probiotic (250MG  Capsule, Oral) Active. Multi Vitamin (Oral) Active. Medications Reconciled  Social History  Alcohol use Remotely quit alcohol use. No caffeine use No drug use Tobacco use Former smoker.  Family History  Arthritis Daughter, Father. Breast Cancer Mother. Melanoma Family Members In General.  Pregnancy / Birth History  Age at menarche 23 years. Age of menopause >89 Gravida 2 Maternal age 24-30 Para 2    Review of Systems General Not Present- Appetite Loss, Chills, Fatigue, Fever, Night Sweats, Weight Gain and Weight Loss. Skin Present- Dryness. Not Present- Change in Wart/Mole, Hives, Jaundice, New Lesions, Non-Healing Wounds, Rash and Ulcer. Cardiovascular Present- Leg Cramps and Swelling of Extremities. Not Present- Chest Pain, Difficulty Breathing Lying Down,  Palpitations, Rapid Heart Rate and Shortness of Breath. Musculoskeletal Present- Joint  Pain and Joint Stiffness. Not Present- Back Pain, Muscle Pain, Muscle Weakness and Swelling of Extremities. Hematology Present- Easy Bruising. Not Present- Blood Thinners, Excessive bleeding, Gland problems, HIV and Persistent Infections.  Vitals  Weight: 216.4 lb Height: 66.5in Body Surface Area: 2.08 m Body Mass Index: 34.4 kg/m  Pulse: 75 (Regular)  BP: 124/80 (Sitting, Left Arm, Standard)     Physical Exam  General Mental Status-Alert. General Appearance-Consistent with stated age. Hydration-Well hydrated. Voice-Normal. Note: BMI 34   Integumentary Note: Well-healed scars right forearm and right upper back from previous melanoma resections. No evidence of recurrent melanoma or satellites. No adenopathy.   Head and Neck Head-normocephalic, atraumatic with no lesions or palpable masses. Trachea-midline. Thyroid Gland Characteristics - normal size and consistency.  Eye Eyeball - Bilateral-Extraocular movements intact. Sclera/Conjunctiva - Bilateral-No scleral icterus.  Chest and Lung Exam Chest and lung exam reveals -quiet, even and easy respiratory effort with no use of accessory muscles and on auscultation, normal breath sounds, no adventitious sounds and normal vocal resonance. Inspection Chest Wall - Normal. Back - normal. Note: Lungs are clear. Right costal margin is tender but abdomen is soft.   Cardiovascular Cardiovascular examination reveals -normal heart sounds, regular rate and rhythm with no murmurs and normal pedal pulses bilaterally.  Abdomen Note: Soft and nontender. Lower midline incision well healed. No hernia. No mass. No organomegaly.   Neurologic Neurologic evaluation reveals -alert and oriented x 3 with no impairment of recent or remote memory. Mental Status-Normal.  Musculoskeletal Normal Exam - Left-Upper  Extremity Strength Normal and Lower Extremity Strength Normal. Normal Exam - Right-Upper Extremity Strength Normal and Lower Extremity Strength Normal.  Lymphatic Head & Neck  General Head & Neck Lymphatics: Bilateral - Description - Normal. Axillary  General Axillary Region: Bilateral - Description - Normal. Tenderness - Non Tender. Femoral & Inguinal  Generalized Femoral & Inguinal Lymphatics: Bilateral - Description - Normal. Tenderness - Non Tender.    Assessment & Plan  GALLSTONES (K80.20) . Your imaging studies show gallstones 1 year ago you had mild elevation of his liver function tests when you went to the emergency room with abdominal pain You have had a few episodes of upper abdominal pain and back pain which are consistent with gallbladder attacks You had a recent chest CT because of the pain and that rules out major heart or blood vessel disease. Dr. Marin Olp is going to follow you for the tiny pulmonary nodule  Most likely you're having gallbladder attacks, and most likely this will continue in the future My advice is to have an elective cholecystectomy without any further x-rays tests you will be scheduled for laparoscopic cholecystectomy with cholangiogram, possible open cholecystectomy in the near future I discussed the indications, techniques, and numerous risk of the surgery in detail  HISTORY OF MELANOMA (Z85.820) Impression: Right upper back and shoulder, right arm. Followed by Dr. Marin Olp, Dr. Allyson Sabal, and Dr. Dalbert Batman PULMONARY NODULE (R91.1) Impression: 9 mm. Followed by Dr. Marin Olp. 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 Dalbert Batman, M.D., [facility] General and Minimally invasive Surgery Breast and Colorectal Surgery Office:   [phone] Pager:   [phone]

## 2016-03-19 ENCOUNTER — Ambulatory Visit (HOSPITAL_COMMUNITY): Payer: Medicare Other | Admitting: Anesthesiology

## 2016-03-19 ENCOUNTER — Encounter (HOSPITAL_COMMUNITY)
Admission: RE | Disposition: A | Discharge status: Home / Self Care | Payer: Self-pay | Source: Ambulatory Visit | Attending: General Surgery

## 2016-03-19 ENCOUNTER — Encounter (HOSPITAL_COMMUNITY): Payer: Self-pay | Admitting: General Practice

## 2016-03-19 ENCOUNTER — Ambulatory Visit (HOSPITAL_COMMUNITY): Payer: Medicare Other

## 2016-03-19 ENCOUNTER — Ambulatory Visit (HOSPITAL_COMMUNITY)
Admission: RE | Admit: 2016-03-19 | Discharge: 2016-03-19 | Disposition: A | Discharge status: Home / Self Care | Payer: Medicare Other | Source: Ambulatory Visit | Attending: General Surgery | Admitting: General Surgery

## 2016-03-19 DIAGNOSIS — Z85820 Personal history of malignant melanoma of skin: Secondary | ICD-10-CM | POA: Insufficient documentation

## 2016-03-19 DIAGNOSIS — E669 Obesity, unspecified: Secondary | ICD-10-CM | POA: Insufficient documentation

## 2016-03-19 DIAGNOSIS — M1990 Unspecified osteoarthritis, unspecified site: Secondary | ICD-10-CM | POA: Insufficient documentation

## 2016-03-19 DIAGNOSIS — Z87891 Personal history of nicotine dependence: Secondary | ICD-10-CM | POA: Insufficient documentation

## 2016-03-19 DIAGNOSIS — E7800 Pure hypercholesterolemia, unspecified: Secondary | ICD-10-CM | POA: Diagnosis not present

## 2016-03-19 DIAGNOSIS — Z79899 Other long term (current) drug therapy: Secondary | ICD-10-CM | POA: Insufficient documentation

## 2016-03-19 DIAGNOSIS — Z808 Family history of malignant neoplasm of other organs or systems: Secondary | ICD-10-CM | POA: Insufficient documentation

## 2016-03-19 DIAGNOSIS — Z6834 Body mass index (BMI) 34.0-34.9, adult: Secondary | ICD-10-CM | POA: Insufficient documentation

## 2016-03-19 DIAGNOSIS — Z8261 Family history of arthritis: Secondary | ICD-10-CM | POA: Insufficient documentation

## 2016-03-19 DIAGNOSIS — K828 Other specified diseases of gallbladder: Secondary | ICD-10-CM | POA: Diagnosis not present

## 2016-03-19 DIAGNOSIS — K8020 Calculus of gallbladder without cholecystitis without obstruction: Secondary | ICD-10-CM

## 2016-03-19 DIAGNOSIS — Z888 Allergy status to other drugs, medicaments and biological substances status: Secondary | ICD-10-CM | POA: Insufficient documentation

## 2016-03-19 DIAGNOSIS — K449 Diaphragmatic hernia without obstruction or gangrene: Secondary | ICD-10-CM | POA: Insufficient documentation

## 2016-03-19 DIAGNOSIS — E039 Hypothyroidism, unspecified: Secondary | ICD-10-CM | POA: Insufficient documentation

## 2016-03-19 DIAGNOSIS — Z7982 Long term (current) use of aspirin: Secondary | ICD-10-CM | POA: Insufficient documentation

## 2016-03-19 DIAGNOSIS — Z885 Allergy status to narcotic agent status: Secondary | ICD-10-CM | POA: Insufficient documentation

## 2016-03-19 DIAGNOSIS — K8010 Calculus of gallbladder with chronic cholecystitis without obstruction: Secondary | ICD-10-CM

## 2016-03-19 DIAGNOSIS — Z803 Family history of malignant neoplasm of breast: Secondary | ICD-10-CM | POA: Insufficient documentation

## 2016-03-19 HISTORY — DX: Calculus of gallbladder with chronic cholecystitis without obstruction: K80.10

## 2016-03-19 HISTORY — PX: CHOLECYSTECTOMY: SHX55

## 2016-03-19 SURGERY — LAPAROSCOPIC CHOLECYSTECTOMY WITH INTRAOPERATIVE CHOLANGIOGRAM
Anesthesia: General | Site: Abdomen

## 2016-03-19 MED ORDER — SODIUM CHLORIDE 0.9 % IR SOLN
Status: DC | PRN
  Administered 2016-03-19: 1000 mL

## 2016-03-19 MED ORDER — HYDROCODONE-ACETAMINOPHEN 5-325 MG PO TABS: 1.0000 | ORAL_TABLET | ORAL | 0 refills | Status: DC | PRN

## 2016-03-19 MED ORDER — SODIUM CHLORIDE 0.9 % IV SOLN
INTRAVENOUS | Status: DC | PRN
  Administered 2016-03-19: 40 mL

## 2016-03-19 MED ORDER — FENTANYL CITRATE (PF) 100 MCG/2ML IJ SOLN
INTRAMUSCULAR | Status: AC
  Filled 2016-03-19: qty 4

## 2016-03-19 MED ORDER — BUPIVACAINE-EPINEPHRINE 0.5% -1:200000 IJ SOLN
INTRAMUSCULAR | Status: DC | PRN
  Administered 2016-03-19: 13 mL

## 2016-03-19 MED ORDER — LIDOCAINE HCL (CARDIAC) 20 MG/ML IV SOLN
INTRAVENOUS | Status: DC | PRN
  Administered 2016-03-19: 60 mg via INTRAVENOUS

## 2016-03-19 MED ORDER — PROPOFOL 10 MG/ML IV BOLUS
INTRAVENOUS | Status: DC | PRN
  Administered 2016-03-19: 130 mg via INTRAVENOUS

## 2016-03-19 MED ORDER — 0.9 % SODIUM CHLORIDE (POUR BTL) OPTIME
TOPICAL | Status: DC | PRN
  Administered 2016-03-19: 1000 mL

## 2016-03-19 MED ORDER — PHENYLEPHRINE HCL 10 MG/ML IJ SOLN
INTRAVENOUS | Status: DC | PRN
  Administered 2016-03-19: 20 ug/min via INTRAVENOUS

## 2016-03-19 MED ORDER — ONDANSETRON HCL 4 MG/2ML IJ SOLN
INTRAMUSCULAR | Status: DC | PRN
  Administered 2016-03-19: 4 mg via INTRAVENOUS

## 2016-03-19 MED ORDER — EPHEDRINE SULFATE 50 MG/ML IJ SOLN
INTRAMUSCULAR | Status: DC | PRN
  Administered 2016-03-19: 5 mg via INTRAVENOUS

## 2016-03-19 MED ORDER — LACTATED RINGERS IV SOLN
INTRAVENOUS | Status: DC
  Administered 2016-03-19 (×2): via INTRAVENOUS

## 2016-03-19 MED ORDER — KETOROLAC TROMETHAMINE 30 MG/ML IJ SOLN
INTRAMUSCULAR | Status: DC | PRN
  Administered 2016-03-19: 30 mg via INTRAVENOUS

## 2016-03-19 MED ORDER — FENTANYL CITRATE (PF) 100 MCG/2ML IJ SOLN
25.0000 ug | INTRAMUSCULAR | Status: DC | PRN
  Administered 2016-03-19 (×2): 25 ug via INTRAVENOUS

## 2016-03-19 MED ORDER — MIDAZOLAM HCL 5 MG/5ML IJ SOLN
INTRAMUSCULAR | Status: DC | PRN
  Administered 2016-03-19: 1 mg via INTRAVENOUS

## 2016-03-19 MED ORDER — FENTANYL CITRATE (PF) 100 MCG/2ML IJ SOLN
INTRAMUSCULAR | Status: AC
  Filled 2016-03-19: qty 2

## 2016-03-19 MED ORDER — BUPIVACAINE-EPINEPHRINE (PF) 0.5% -1:200000 IJ SOLN
INTRAMUSCULAR | Status: AC
  Filled 2016-03-19: qty 30

## 2016-03-19 MED ORDER — DEXAMETHASONE SODIUM PHOSPHATE 10 MG/ML IJ SOLN
INTRAMUSCULAR | Status: DC | PRN
  Administered 2016-03-19: 8 mg via INTRAVENOUS

## 2016-03-19 MED ORDER — IOPAMIDOL (ISOVUE-300) INJECTION 61%
INTRAVENOUS | Status: AC
  Filled 2016-03-19: qty 50

## 2016-03-19 MED ORDER — SUGAMMADEX SODIUM 200 MG/2ML IV SOLN
INTRAVENOUS | Status: DC | PRN
  Administered 2016-03-19: 200 mg via INTRAVENOUS

## 2016-03-19 MED ORDER — ROCURONIUM BROMIDE 100 MG/10ML IV SOLN
INTRAVENOUS | Status: DC | PRN
  Administered 2016-03-19: 40 mg via INTRAVENOUS
  Administered 2016-03-19: 10 mg via INTRAVENOUS

## 2016-03-19 MED ORDER — PHENYLEPHRINE HCL 10 MG/ML IJ SOLN
INTRAMUSCULAR | Status: DC | PRN
  Administered 2016-03-19: 80 ug via INTRAVENOUS

## 2016-03-19 MED ORDER — CEFAZOLIN SODIUM-DEXTROSE 2-4 GM/100ML-% IV SOLN
2.0000 g | INTRAVENOUS | Status: DC
  Filled 2016-03-19: qty 100

## 2016-03-19 MED ORDER — FENTANYL CITRATE (PF) 100 MCG/2ML IJ SOLN
INTRAMUSCULAR | Status: DC | PRN
  Administered 2016-03-19 (×2): 50 ug via INTRAVENOUS
  Administered 2016-03-19: 100 ug via INTRAVENOUS

## 2016-03-19 MED ORDER — PROPOFOL 10 MG/ML IV BOLUS
INTRAVENOUS | Status: AC
  Filled 2016-03-19: qty 20

## 2016-03-19 MED ORDER — MIDAZOLAM HCL 2 MG/2ML IJ SOLN
INTRAMUSCULAR | Status: AC
  Filled 2016-03-19: qty 2

## 2016-03-19 SURGICAL SUPPLY — 49 items
ADH SKN CLS APL DERMABOND .7 (GAUZE/BANDAGES/DRESSINGS) ×1
APPLIER CLIP LOGIC TI 5 (MISCELLANEOUS) ×2 IMPLANT
APPLIER CLIP ROT 10 11.4 M/L (STAPLE) ×3
APR CLP MED LRG 11.4X10 (STAPLE) ×1
APR CLP MED LRG 33X5 (MISCELLANEOUS) ×1
BAG SPEC RTRVL LRG 6X4 10 (ENDOMECHANICALS) ×1
BLADE SURG ROTATE 9660 (MISCELLANEOUS) IMPLANT
CANISTER SUCTION 2500CC (MISCELLANEOUS) ×3 IMPLANT
CHLORAPREP W/TINT 26ML (MISCELLANEOUS) ×3 IMPLANT
CLIP APPLIE ROT 10 11.4 M/L (STAPLE) ×1 IMPLANT
COVER MAYO STAND STRL (DRAPES) ×3 IMPLANT
COVER SURGICAL LIGHT HANDLE (MISCELLANEOUS) ×3 IMPLANT
DERMABOND ADVANCED (GAUZE/BANDAGES/DRESSINGS) ×2
DERMABOND ADVANCED .7 DNX12 (GAUZE/BANDAGES/DRESSINGS) ×1 IMPLANT
DRAPE C-ARM 42X72 X-RAY (DRAPES) ×3 IMPLANT
ELECT REM PT RETURN 9FT ADLT (ELECTROSURGICAL) ×3
ELECTRODE REM PT RTRN 9FT ADLT (ELECTROSURGICAL) ×1 IMPLANT
GLOVE BIO SURGEON STRL SZ7 (GLOVE) ×2 IMPLANT
GLOVE BIOGEL PI IND STRL 7.0 (GLOVE) IMPLANT
GLOVE BIOGEL PI INDICATOR 7.0 (GLOVE) ×6
GLOVE ECLIPSE 6.5 STRL STRAW (GLOVE) ×2 IMPLANT
GLOVE EUDERMIC 7 POWDERFREE (GLOVE) ×3 IMPLANT
GLOVE SURG SS PI 6.5 STRL IVOR (GLOVE) ×2 IMPLANT
GLOVE SURG SS PI 7.0 STRL IVOR (GLOVE) ×2 IMPLANT
GOWN STRL REUS W/ TWL LRG LVL3 (GOWN DISPOSABLE) ×2 IMPLANT
GOWN STRL REUS W/ TWL XL LVL3 (GOWN DISPOSABLE) ×1 IMPLANT
GOWN STRL REUS W/TWL LRG LVL3 (GOWN DISPOSABLE) ×6
GOWN STRL REUS W/TWL XL LVL3 (GOWN DISPOSABLE) ×3
KIT BASIN OR (CUSTOM PROCEDURE TRAY) ×3 IMPLANT
KIT ROOM TURNOVER OR (KITS) ×3 IMPLANT
L-HOOK LAP DISP 36CM (ELECTROSURGICAL) ×3
LHOOK LAP DISP 36CM (ELECTROSURGICAL) IMPLANT
NS IRRIG 1000ML POUR BTL (IV SOLUTION) ×3 IMPLANT
PAD ARMBOARD 7.5X6 YLW CONV (MISCELLANEOUS) ×5 IMPLANT
PENCIL BUTTON HOLSTER BLD 10FT (ELECTRODE) ×2 IMPLANT
POUCH SPECIMEN RETRIEVAL 10MM (ENDOMECHANICALS) ×3 IMPLANT
SCISSORS LAP 5X35 DISP (ENDOMECHANICALS) ×3 IMPLANT
SET CHOLANGIOGRAPH 5 50 .035 (SET/KITS/TRAYS/PACK) ×3 IMPLANT
SET IRRIG TUBING LAPAROSCOPIC (IRRIGATION / IRRIGATOR) ×3 IMPLANT
SLEEVE ENDOPATH XCEL 5M (ENDOMECHANICALS) ×9 IMPLANT
SPECIMEN JAR SMALL (MISCELLANEOUS) ×3 IMPLANT
SUT MNCRL AB 4-0 PS2 18 (SUTURE) ×3 IMPLANT
TOWEL OR 17X24 6PK STRL BLUE (TOWEL DISPOSABLE) ×3 IMPLANT
TOWEL OR 17X26 10 PK STRL BLUE (TOWEL DISPOSABLE) ×1 IMPLANT
TRAY LAPAROSCOPIC MC (CUSTOM PROCEDURE TRAY) ×3 IMPLANT
TROCAR XCEL BLUNT TIP 100MML (ENDOMECHANICALS) ×1 IMPLANT
TROCAR XCEL NON-BLD 11X100MML (ENDOMECHANICALS) ×3 IMPLANT
TROCAR XCEL NON-BLD 5MMX100MML (ENDOMECHANICALS) ×1 IMPLANT
TUBING INSUFFLATION (TUBING) ×3 IMPLANT

## 2016-03-19 NOTE — Anesthesia Postprocedure Evaluation (Signed)
Anesthesia Post Note  Patient: Sara Bryan  Procedure(s) Performed: Procedure(s) (LRB): LAPAROSCOPIC CHOLECYSTECTOMY WITH INTRAOPERATIVE CHOLANGIOGRAM (N/A)  Patient location during evaluation: PACU Anesthesia Type: General Level of consciousness: awake and alert Pain management: pain level controlled Vital Signs Assessment: post-procedure vital signs reviewed and stable Respiratory status: spontaneous breathing, nonlabored ventilation, respiratory function stable and patient connected to nasal cannula oxygen Cardiovascular status: blood pressure returned to baseline and stable Postop Assessment: no signs of nausea or vomiting Anesthetic complications: no    Last Vitals:  Vitals:   03/19/16 1315 03/19/16 1330  BP: 120/66 128/73  Pulse: 63 67  Resp: 13 14  Temp: 36.6 C     Last Pain:  Vitals:   03/19/16 1315  TempSrc:   PainSc: 0-No pain                 Shauniece Kwan J

## 2016-03-19 NOTE — Anesthesia Preprocedure Evaluation (Addendum)
Anesthesia Evaluation  Patient identified by MRN, date of birth, ID band Patient awake    Reviewed: Allergy & Precautions, NPO status , Patient's Chart, lab work & pertinent test results  History of Anesthesia Complications (+) AWARENESS UNDER ANESTHESIA and history of anesthetic complications  Airway Mallampati: II  TM Distance: >3 FB Neck ROM: Full    Dental no notable dental hx.    Pulmonary neg pulmonary ROS, former smoker,  Left upper lung nodule being worked up. Oncology office visit of 02-26-16 reviewed.   Pulmonary exam normal breath sounds clear to auscultation       Cardiovascular Exercise Tolerance: Good Normal cardiovascular exam Rhythm:Regular Rate:Normal  SVT 2007. Sees cardiologist once per year.   Neuro/Psych negative neurological ROS  negative psych ROS   GI/Hepatic Neg liver ROS, hiatal hernia,   Endo/Other  negative endocrine ROS  Renal/GU negative Renal ROS  negative genitourinary   Musculoskeletal  (+) Arthritis ,   Abdominal (+) + obese,   Peds negative pediatric ROS (+)  Hematology negative hematology ROS (+)   Anesthesia Other Findings   Reproductive/Obstetrics negative OB ROS                         Anesthesia Physical Anesthesia Plan  ASA: II  Anesthesia Plan: General   Post-op Pain Management:    Induction: Intravenous  Airway Management Planned: Oral ETT  Additional Equipment:   Intra-op Plan:   Post-operative Plan: Extubation in OR  Informed Consent: I have reviewed the patients History and Physical, chart, labs and discussed the procedure including the risks, benefits and alternatives for the proposed anesthesia with the patient or authorized representative who has indicated his/her understanding and acceptance.   Dental advisory given  Plan Discussed with: CRNA  Anesthesia Plan Comments:         Anesthesia Quick Evaluation

## 2016-03-19 NOTE — Op Note (Signed)
Patient Name:           Sara Bryan   Date of Surgery:        03/19/2016  Pre op Diagnosis:      Cholecystitis with cholelithiasis  Post op Diagnosis:    Same  Procedure:                 Laparoscopic cholecystectomy with cholangiogram, lysis of adhesions   Surgeon:                     Edsel Petrin. Dalbert Batman, M.D., FACS  Assistant:                      Gurney Maxin, M.D.   Indication for Assistant: Complex adhesions.  Complex exposure.  Surgeon assist to provide exposure and the cyst to lower risk of injury and complications.  Operative Indications:  . This is a very pleasant 75 year old Caucasian female but I have known for many years. She was referred by Dr. Antony Contras for evaluation of symptomatic gallstones      On August 25, 2014 , one year ago, she presented to the emergency room with upper abdominal pain. She was noted have elevated AST and ALT. Ultrasound showed gallstones but no complications. She was advised to have surgery but declined at that time. She's had a couple of other milder attacks. More recently she had a more severe attack of epigastric pain radiating to the back. CT angiogram chest showed no aneurysm, no pulmonary embolism. Tiny pulmonary nodule was noted to have enlarged and Dr. Marin Olp is following her for this and plans three-month CT follow-up Her episodes of pain are sometimes related to meals. One particular after eating some hot dogs. Sometimes occurs at night after supper. Denies nausea or vomiting. Asymptomatic today. No history of pancreatitis or jaundice.     Comorbidities include two early melanoma resections by me. No recurrence. Followed by Dr. Allyson Sabal and Dr. Marin Olp. 2 C-sections. 3 ventral hernia repairs through lower midline incision by me.. Almost certainly mesh in place. No recurrence of hernia. No upper abdominal surgery.      I offered to proceed with cholecystectomy without further imaging workup. She would like to do  that. I discussed the indications, details, techniques, and numerous risk of laparoscopic cholecystectomy with cholangiogram. She understands all of these issues. All of her questions are answered. She is aware of the risk of bleeding, infection, conversion to open laparotomy, bile leak, hernia, injury to adjacent organs, and other unforeseen problems. She is ready to schedule and we will proceed with scheduling  Operative Findings:       The gallbladder was chronically inflamed.  Discolored.  Slightly thick walled.  Extensive omental and duodenal adhesions.  The adhesions to the gallbladder were relatively soft and could be taken down with cold scissor.  Some adhesions of the transverse colon to the anterior abdominal wall in the right upper quadrant.  Extensive adhesions in the lower midline from multiple mesh hernia repairs.  No evidence of recurrent hernia.  The cholangiogram was basically normal.  It showed normal intrahepatic and extrahepatic biliary anatomy, no obstruction.    on one of the images it looked  like there might be a couple of tiny air bubbles in the distal CBD.  This was discussed with radiology and they felt that there was no stone possibly air bubble but no obstruction most likely normal.  Procedure in Detail:  Following the induction of general endotracheal anesthesia a surgical timeout was performed.  The abdomen was prepped and draped in a sterile fashion.  Intravenous antibiotic given.  0.5% Marcaine with epinephrine was used as local infiltration anesthetic.     A 5 mm optical per trocar was placed in the left subcostal region without difficulty.  Pneumoperitoneum was created.  Video camera was inserted.  There is no evidence of bleeding or injury.  I placed a 5 mm trocar in the epigastric region.  Two 5 mm trochars in the right upper quadrant.  A 10 mm trochar placed in the midline above the umbilicus, under direct vision and above the adhesions.  Using scissor cautery  I took down the transverse colon adhesions to the anterior abdominal wall.  We were able to elevate the fundus of the gallbladder.  Using scissor dissection I took the duodenal and omental adhesions off of the gallbladder and we could then retract the infundibulum.  I dissected the neck of the gallbladder until I identified the cystic duct and the cystic artery.  I created a nice critical view behind these 2 structures.  The cystic artery was secured with multiple metal  clips and divided.  A cholangiogram catheter was inserted into the cystic duct and a cholangiogram obtained using the C-arm.  The cholangiogram was basically normal as described above.  The cholangiocatheter was removed.  The cystic duct was secured with multiple metal clips and divided.  A small posterior branch of the cystic artery was controlled with metal clips and divided.  Gallbladder was dissected from its bed with electrocautery, placed in a specimen bag and removed.      The operative field was irrigated and inspected.  There was no bleeding.  There was no bile leak.  The irrigation fluid was removed.  Four-quadrant survey revealed no evidence of any injury to the intestine.  The pneumoperitoneum was released and the trochars removed.  Skin incisions were closed with subcuticular sutures of 4-0 Monocryl and Dermabond.  The patient tolerated the procedure well was taken to PACU in stable condition.  EBL 25 mL or less.  Counts correct.  Complications none.     Edsel Petrin. Dalbert Batman, M.D., FACS General and Minimally Invasive Surgery Breast and Colorectal Surgery  03/19/2016 11:37 AM

## 2016-03-19 NOTE — Anesthesia Procedure Notes (Signed)
Procedure Name: Intubation Date/Time: 03/19/2016 10:18 AM Performed by: Oletta Lamas Pre-anesthesia Checklist: Patient identified, Emergency Drugs available, Suction available and Patient being monitored Patient Re-evaluated:Patient Re-evaluated prior to inductionOxygen Delivery Method: Circle System Utilized Preoxygenation: Pre-oxygenation with 100% oxygen Intubation Type: IV induction Ventilation: Mask ventilation without difficulty Laryngoscope Size: Mac and 3 Grade View: Grade I Tube type: Oral Number of attempts: 1 Airway Equipment and Method: Stylet and Oral airway Placement Confirmation: ETT inserted through vocal cords under direct vision,  positive ETCO2 and breath sounds checked- equal and bilateral Secured at: 22 cm Tube secured with: Tape Dental Injury: Teeth and Oropharynx as per pre-operative assessment  Difficulty Due To: Difficulty was unanticipated

## 2016-03-19 NOTE — Discharge Instructions (Signed)
[facility] ______[facility] °LAPAROSCOPIC SURGERY: POST OP INSTRUCTIONS °Always review your discharge instruction sheet given to you by the facility where your surgery was performed. °IF YOU HAVE DISABILITY OR FAMILY LEAVE FORMS, YOU MUST BRING THEM TO THE OFFICE FOR PROCESSING.   °DO NOT GIVE THEM TO YOUR DOCTOR. ° °1. A prescription for pain medication may be given to you upon discharge.  Take your pain medication as prescribed, if needed.  If narcotic pain medicine is not needed, then you may take acetaminophen (Tylenol) or ibuprofen (Advil) as needed. °2. Take your usually prescribed medications unless otherwise directed. °3. If you need a refill on your pain medication, please contact your pharmacy.  They will contact our office to request authorization. Prescriptions will not be filled after 5pm or on week-ends. °4. You should follow a light diet the first few days after arrival home, such as soup and crackers, etc.  Be sure to include lots of fluids daily. °5. Most patients will experience some swelling and bruising in the area of the incisions.  Ice packs will help.  Swelling and bruising can take several days to resolve.  °6. It is common to experience some constipation if taking pain medication after surgery.  Increasing fluid intake and taking a stool softener (such as Colace) will usually help or prevent this problem from occurring.  A mild laxative (Milk of Magnesia or Miralax) should be taken according to package instructions if there are no bowel movements after 48 hours. °7. Unless discharge instructions indicate otherwise, you may remove your bandages 24-48 hours after surgery, and you may shower at that time.  You may have steri-strips (small skin tapes) in place directly over the incision.  These strips should be left on the skin for 7-10 days.  If your surgeon used skin glue on the incision, you may shower in 24 hours.  The glue will flake off over the next 2-3 weeks.  Any sutures or  staples will be removed at the office during your follow-up visit. °8. ACTIVITIES:  You may resume regular (light) daily activities beginning the next day--such as daily self-care, walking, climbing stairs--gradually increasing activities as tolerated.  You may have sexual intercourse when it is comfortable.  Refrain from any heavy lifting or straining until approved by your doctor. °a. You may drive when you are no longer taking prescription pain medication, you can comfortably wear a seatbelt, and you can safely maneuver your car and apply brakes. °b. RETURN TO WORK:  __________________________________________________________ °9. You should see your doctor in the office for a follow-up appointment approximately 2-3 weeks after your surgery.  Make sure that you call for this appointment within a day or two after you arrive home to insure a convenient appointment time. °10. OTHER INSTRUCTIONS: __________________________________________________________________________________________________________________________ __________________________________________________________________________________________________________________________ °WHEN TO CALL YOUR DOCTOR: °1. Fever over 101.0 °2. Inability to urinate °3. Continued bleeding from incision. °4. Increased pain, redness, or drainage from the incision. °5. Increasing abdominal pain ° °The clinic staff is available to answer your questions during regular business hours.  Please don’t hesitate to call and ask to speak to one of the nurses for clinical concerns.  If you have a medical emergency, go to the nearest emergency room or call 911.  A surgeon from [facility] is always on call at the hospital. °[address], [address], [place], [place]  [address] ? [address] [address], [place], [place]   [address] °[phone] ? [phone] ? FAX [phone] °Web site:   [email or web address] °

## 2016-03-19 NOTE — Interval H&P Note (Signed)
History and Physical Interval Note:  03/19/2016 9:27 AM  Sara Bryan  has presented today for surgery, with the diagnosis of Gallstones  The various methods of treatment have been discussed with the patient and family. After consideration of risks, benefits and other options for treatment, the patient has consented to  Procedure(s): LAPAROSCOPIC CHOLECYSTECTOMY WITH INTRAOPERATIVE CHOLANGIOGRAM (N/A) as a surgical intervention .  The patient's history has been reviewed, patient examined, no change in status, stable for surgery.  I have reviewed the patient's chart and labs.  Questions were answered to the patient's satisfaction.     Adin Hector

## 2016-03-19 NOTE — Transfer of Care (Signed)
Immediate Anesthesia Transfer of Care Note  Patient: Sara Bryan  Procedure(s) Performed: Procedure(s): LAPAROSCOPIC CHOLECYSTECTOMY WITH INTRAOPERATIVE CHOLANGIOGRAM (N/A)  Patient Location: PACU  Anesthesia Type:General  Level of Consciousness: awake, alert  and oriented  Airway & Oxygen Therapy: Patient Spontanous Breathing and Patient connected to nasal cannula oxygen  Post-op Assessment: Report given to RN and Post -op Vital signs reviewed and stable  Post vital signs: Reviewed and stable  Last Vitals:  Vitals:   03/19/16 0857 03/19/16 1145  BP: (!) 144/61   Pulse: 68   Resp: 18   Temp: 36.6 C (P) 36.5 C    Last Pain:  Vitals:   03/19/16 1145  TempSrc:   PainSc: (P) 0-No pain      Patients Stated Pain Goal: 3 ([id number])  Complications: No apparent anesthesia complications

## 2016-03-20 ENCOUNTER — Encounter (HOSPITAL_COMMUNITY): Payer: Self-pay | Admitting: General Surgery

## 2016-03-26 ENCOUNTER — Telehealth: Payer: Self-pay | Admitting: *Deleted

## 2016-03-26 ENCOUNTER — Other Ambulatory Visit: Payer: Self-pay | Admitting: *Deleted

## 2016-03-26 NOTE — Telephone Encounter (Signed)
Received a call from Dr. Vaughan Browner office stating that they saw patient in pulmonary consult and recommend cancelling the  CT scan and doing a PET scan instead.  Dr. Marin Olp notified and is ok with this. CT scan cancelled.

## 2016-04-01 ENCOUNTER — Encounter: Payer: Self-pay | Admitting: Pulmonary Disease

## 2016-05-12 ENCOUNTER — Encounter (HOSPITAL_COMMUNITY)
Admission: RE | Admit: 2016-05-12 | Discharge: 2016-05-12 | Disposition: A | Discharge status: Home / Self Care | Payer: Medicare Other | Source: Ambulatory Visit | Attending: Pulmonary Disease | Admitting: Pulmonary Disease

## 2016-05-12 DIAGNOSIS — R911 Solitary pulmonary nodule: Secondary | ICD-10-CM

## 2016-05-12 LAB — GLUCOSE, CAPILLARY: GLUCOSE-CAPILLARY: 103 mg/dL — AB (ref 65–99)

## 2016-05-12 MED ORDER — FLUDEOXYGLUCOSE F - 18 (FDG) INJECTION
10.1300 | Freq: Once | INTRAVENOUS | Status: AC | PRN
  Administered 2016-05-12: 10.13 via INTRAVENOUS

## 2016-05-13 NOTE — Progress Notes (Signed)
LMOMTCB x 1 

## 2016-05-14 ENCOUNTER — Telehealth: Payer: Self-pay | Admitting: Pulmonary Disease

## 2016-05-14 DIAGNOSIS — R918 Other nonspecific abnormal finding of lung field: Secondary | ICD-10-CM

## 2016-05-14 NOTE — Telephone Encounter (Signed)
Patient request to speak with Sara Bryan. Returning phone call.

## 2016-05-14 NOTE — Telephone Encounter (Signed)
Notes Recorded by Marshell Garfinkel, MD on 05/13/2016 at 5:47 AM EST Please let the patient know that the PET scan does not show any uptake, there is no evidence of malignancy. We will follow up with a repeat CT without contrast in 1 year. Please order. -------------------------------------------- Spoke with pt. She is aware of results. Order has been placed for CT. Nothing further was needed.

## 2016-05-16 ENCOUNTER — Other Ambulatory Visit: Payer: Self-pay

## 2016-05-16 DIAGNOSIS — C4361 Malignant melanoma of right upper limb, including shoulder: Secondary | ICD-10-CM

## 2016-05-19 ENCOUNTER — Encounter: Payer: Self-pay | Admitting: Pulmonary Disease

## 2016-05-19 ENCOUNTER — Other Ambulatory Visit: Payer: Medicare Other

## 2016-05-19 ENCOUNTER — Ambulatory Visit (INDEPENDENT_AMBULATORY_CARE_PROVIDER_SITE_OTHER): Payer: Medicare Other | Admitting: Pulmonary Disease

## 2016-05-19 ENCOUNTER — Other Ambulatory Visit (HOSPITAL_BASED_OUTPATIENT_CLINIC_OR_DEPARTMENT_OTHER): Payer: Medicare Other

## 2016-05-19 ENCOUNTER — Ambulatory Visit: Payer: Medicare Other | Admitting: Hematology & Oncology

## 2016-05-19 VITALS — BP 124/80 | HR 70 | Ht 66.5 in | Wt 216.6 lb

## 2016-05-19 DIAGNOSIS — R918 Other nonspecific abnormal finding of lung field: Secondary | ICD-10-CM | POA: Diagnosis not present

## 2016-05-19 NOTE — Patient Instructions (Signed)
We will schedule you for follow-up CT without contrast in September 2019. Follow-up in clinic after CT scan for review.

## 2016-05-19 NOTE — Progress Notes (Signed)
Sara Bryan    [id number]    06/27/1940  Primary Care Physician:SWAYNE,DAVID W, MD  Referring Physician: Antony Contras, MD [address] [place], [place] [address]  Chief complaint: Follow up for evaluation of abnormal CT scan  HPI: Mrs. Sara Bryan is a 75 year old with past medical history of melanoma status post resection. She has been followed by Dr. Marin Olp with no evidence of recurrence. She had a CT chest to evaluate chest pain and was found to have a left upper lobe nodule. This was seen earlier in 2007 at 3 mm and now is 9 mm. She also has a stable left lower lobe calcified nodule. She denies any primary symptoms of cough, sputum production, fevers, chills, hemoptysis, loss of weight, loss of appetite.   Outpatient Encounter Prescriptions as of 05/19/2016  Medication Sig  . acetaminophen (TYLENOL) 500 MG tablet Take 500 mg by mouth every 6 (six) hours as needed for mild pain.  Marland Kitchen aspirin EC 81 MG tablet Take 81 mg by mouth daily.  Marland Kitchen b complex vitamins tablet Take 1 tablet by mouth daily.    Marland Kitchen CALCIUM PO Take 1,000 mg by mouth daily.   . cetirizine (ZYRTEC) 10 MG tablet Take 10 mg by mouth daily as needed for allergies.  . CHOLECALCIFEROL PO Take 6,000 Units by mouth daily.  . Coenzyme Q10 (CO Q-10 PO) Take 100 mg by mouth daily.   . fish oil-omega-3 fatty acids 1000 MG capsule Take 1 g by mouth daily.    Marland Kitchen HYDROcodone-acetaminophen (NORCO/VICODIN) 5-325 MG tablet Take 1-2 tablets by mouth every 4 (four) hours as needed for moderate pain or severe pain.  Marland Kitchen ibuprofen (ADVIL,MOTRIN) 200 MG tablet Take 200 mg by mouth every 6 (six) hours as needed for moderate pain.  Marland Kitchen levothyroxine (SYNTHROID) 50 MCG tablet Take 50 mcg by mouth daily.  . Multiple Vitamins-Minerals (MULTIVITAMINS THER. W/MINERALS) TABS Take 1 tablet by mouth daily.    . Probiotic Product (PROBIOTIC DAILY PO) Take 1 tablet by mouth daily.   . rosuvastatin (CRESTOR) 10 MG tablet Take 10 mg  by mouth daily.   . vitamin C (ASCORBIC ACID) 500 MG tablet Take 500 mg by mouth daily.     No facility-administered encounter medications on file as of 05/19/2016.     Allergies as of 05/19/2016 - Review Complete 05/19/2016  Allergen Reaction Noted  . Levaquin [levofloxacin] Other (See Comments) 02/27/2014  . Aspirin Nausea Only and Nausea And Vomiting 01/23/2011  . Atorvastatin  12/10/2015  . Codeine Nausea Only 12/10/2015    Past Medical History:  Diagnosis Date  . Arthritis   . Cancer ([place])    Melonoma  . 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 Complication of anesthesia    was aware of c section going on.  . Hernia   . History of hiatal hernia   . Hyperlipidemia   . Supraventricular tachycardia ([place]) 2007   once- cardiologist- dr Acie Fredrickson; yearly  . Varicose veins     Past Surgical History:  Procedure Laterality Date  . CESAREAN SECTION    . CHOLECYSTECTOMY N/A 03/19/2016   Procedure: LAPAROSCOPIC CHOLECYSTECTOMY WITH INTRAOPERATIVE CHOLANGIOGRAM;  Surgeon: Fanny Skates, MD;  Location: [facility];  Service: General;  Laterality: N/A;  . [place]   abd wall  . HERNIA REPAIR    . HERNIA REPAIR    . HERNIA REPAIR    . MELANOMA EXCISION  from back in 2007    Family History  Problem Relation Age of Onset  . Cancer Mother     Breast  . Arthritis Father     RA  . Heart disease Father     cardiomegaly    Social History   Social History  . Marital status: Widowed    Spouse name: N/A  . Number of children: N/A  . Years of education: N/A   Occupational History  . Not on file.   Social History Main Topics  . Smoking status: Former Smoker    Packs/day: 0.50    Years: 20.00    Types: Cigarettes    Start date: 08/30/1959    Quit date: 03/15/1991  . Smokeless tobacco: Never Used     Comment: quit 33 years ago  . Alcohol use No  . Drug use: No  . Sexual activity: No   Other Topics Concern  . Not on  file   Social History Narrative   Widowed   Children:2   Lives alone   Works at the [employer]     Review of systems: Review of Systems  Constitutional: Negative for fever and chills.  HENT: Negative.   Eyes: Negative for blurred vision.  Respiratory: as per HPI  Cardiovascular: Negative for chest pain and palpitations.  Gastrointestinal: Negative for vomiting, diarrhea, blood per rectum. Genitourinary: Negative for dysuria, urgency, frequency and hematuria.  Musculoskeletal: Negative for myalgias, back pain and joint pain.  Skin: Negative for itching and rash.  Neurological: Negative for dizziness, tremors, focal weakness, seizures and loss of consciousness.  Endo/Heme/Allergies: Negative for environmental allergies.  Psychiatric/Behavioral: Negative for depression, suicidal ideas and hallucinations.  All other systems reviewed and are negative.   Physical Exam: Blood pressure 134/76, pulse 72, height 5' 5.6" (1.666 m), weight 217 lb 6.4 oz (98.6 kg), SpO2 97 %. Gen:      No acute distress HEENT:  EOMI, sclera anicteric Neck:     No masses; no thyromegaly Lungs:    Clear to auscultation bilaterally; normal respiratory effort CV:         Regular rate and rhythm; no murmurs Abd:      + bowel sounds; soft, non-tender; no palpable masses, no distension Ext:    No edema; adequate peripheral perfusion Skin:      Warm and dry; no rash Neuro: alert and oriented x 3 Psych: normal mood and affect  Data Reviewed: CT scan 02/20/16- RUL nodule 9 mmm. RLL stable calcified nodule CT scan 05/19/06- Right upper lobe 3 mm nodule, right lower lobe nodule. PET scan 02/20/16- no PET uptake. All images reviewed  Assessment:  Eval for lung nodule. CT scan images from 2007 and 2017 reviewed. The left upper lobe nodule has grown slightly from 3 to 9 mm in 10 years. This is unlikely to be a malignancy as the rate of growth is very slow. I would favor a diagnosis of benign nodule. Negative  PET scan is reassuring.  We will follow up with CT scan in 2 years.   Plan/Recommendations: - Follow CT scan in 2 years.  Marshell Garfinkel MD [facility] Pager [phone] 05/19/2016, 1:59 PM  CC: Antony Contras, MD

## 2016-08-04 ENCOUNTER — Ambulatory Visit (HOSPITAL_BASED_OUTPATIENT_CLINIC_OR_DEPARTMENT_OTHER): Payer: Medicare Other | Admitting: Family

## 2016-08-04 ENCOUNTER — Other Ambulatory Visit (HOSPITAL_BASED_OUTPATIENT_CLINIC_OR_DEPARTMENT_OTHER): Payer: Medicare Other

## 2016-08-04 VITALS — BP 134/54 | HR 62 | Temp 98.3°F | Resp 18 | Wt 217.0 lb

## 2016-08-04 DIAGNOSIS — R911 Solitary pulmonary nodule: Secondary | ICD-10-CM | POA: Diagnosis not present

## 2016-08-04 DIAGNOSIS — Z85820 Personal history of malignant melanoma of skin: Secondary | ICD-10-CM

## 2016-08-04 DIAGNOSIS — C4361 Malignant melanoma of right upper limb, including shoulder: Secondary | ICD-10-CM

## 2016-08-04 DIAGNOSIS — H269 Unspecified cataract: Secondary | ICD-10-CM

## 2016-08-04 LAB — CBC WITH DIFFERENTIAL (CANCER CENTER ONLY)
BASO#: 0.1 10*3/uL (ref 0.0–0.2)
BASO%: 0.5 % (ref 0.0–2.0)
EOS ABS: 0.3 10*3/uL (ref 0.0–0.5)
EOS%: 2.7 % (ref 0.0–7.0)
HCT: 46.2 % (ref 34.8–46.6)
HGB: 16 g/dL — ABNORMAL HIGH (ref 11.6–15.9)
LYMPH#: 2.5 10*3/uL (ref 0.9–3.3)
LYMPH%: 24.9 % (ref 14.0–48.0)
MCH: 30.7 pg (ref 26.0–34.0)
MCHC: 34.6 g/dL (ref 32.0–36.0)
MCV: 89 fL (ref 81–101)
MONO#: 1 10*3/uL — ABNORMAL HIGH (ref 0.1–0.9)
MONO%: 10.2 % (ref 0.0–13.0)
NEUT#: 6.1 10*3/uL (ref 1.5–6.5)
NEUT%: 61.7 % (ref 39.6–80.0)
PLATELETS: 293 10*3/uL (ref 145–400)
RBC: 5.21 10*6/uL (ref 3.70–5.32)
RDW: 13 % (ref 11.1–15.7)
WBC: 9.8 10*3/uL (ref 3.9–10.0)

## 2016-08-04 LAB — COMPREHENSIVE METABOLIC PANEL (CC13)
ALK PHOS: 80 IU/L (ref 39–117)
ALT: 19 IU/L (ref 0–32)
AST: 19 IU/L (ref 0–40)
Albumin, Serum: 3.9 g/dL (ref 3.5–4.8)
Albumin/Globulin Ratio: 1.1 — ABNORMAL LOW (ref 1.2–2.2)
BILIRUBIN TOTAL: 1 mg/dL (ref 0.0–1.2)
BUN/Creatinine Ratio: 28 (ref 12–28)
BUN: 18 mg/dL (ref 8–27)
CHLORIDE: 103 mmol/L (ref 96–106)
CO2: 26 mmol/L (ref 18–29)
CREATININE: 0.64 mg/dL (ref 0.57–1.00)
Calcium, Ser: 9.4 mg/dL (ref 8.7–10.3)
GFR calc Af Amer: 100 mL/min/{1.73_m2} (ref >59)
GFR calc non Af Amer: 86 mL/min/{1.73_m2} (ref >59)
GLUCOSE: 92 mg/dL (ref 65–99)
Globulin, Total: 3.6 g/dL (ref 1.5–4.5)
Potassium, Ser: 4 mmol/L (ref 3.5–5.2)
Sodium: 133 mmol/L — ABNORMAL LOW (ref 134–144)
Total Protein: 7.5 g/dL (ref 6.0–8.5)

## 2016-08-04 NOTE — Progress Notes (Signed)
Hematology and Oncology Follow Up Visit  Sara Bryan [id number] 24-Oct-1940 76 y.o. 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 She is doing well and has no complaints at this time.  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  She does self skin checks at home. There is a small red rough spot, 1 cm x 1 cm, on her left arm where she thinks she may have scratched herself. If this does not heal she plans to follow up with dermatology. She sees them every 6 months and is scheduled to go back in May unless she needs to go earlier.   She followed up with pulmonology in December and her PET scan showed no evidence of malignancy. She will have another follow-up scan in 2 years to monitor the left upper lung lobe nodule.  She has had no problem with frequent infections. No fever, chills, n/v, cough, rash, dizziness, SOB, chest pain, palpitations, abdominal pain or changes in bowel or bladder habits.  Her vision has gotten a little worse. She has cataracts and will be seeing her eye doctor in 2 weeks for follow-up with this.  She has chronic swelling in her left lower extremity and wears compression stockings daily where she is on her feet a good bit.  No tenderness, numbness or tingling in her extremities. No c/o pain.  She is over due for her annual mammogram.  She has maintained a good appetite and is staying hydrated. She states that she is watching her portions and trying to exercise to lose weight.    Medications:  Allergies as of 08/04/2016      Reactions   Levaquin [levofloxacin] Other (See Comments)   Diarrhea/Muscle weakness/inability to lift arms/limbs   Aspirin Nausea Only, Nausea And Vomiting   Enteric is ok   Atorvastatin    Other reaction(s): myalgias   Codeine Nausea Only        Medication List       Accurate as of 08/04/16  3:35 PM. Always use your most recent med list.          acetaminophen 500 MG tablet Commonly known as:  TYLENOL Take 500 mg by mouth every 6 (six) hours as needed for mild pain.   aspirin EC 81 MG tablet Take 81 mg by mouth daily.   b complex vitamins tablet Take 1 tablet by mouth daily.   CALCIUM PO Take 1,000 mg by mouth daily.   cetirizine 10 MG tablet Commonly known as:  ZYRTEC Take 10 mg by mouth daily as needed for allergies.   CHOLECALCIFEROL PO Take 6,000 Units by mouth daily.   CO Q-10 PO Take 100 mg by mouth daily.   fish oil-omega-3 fatty acids 1000 MG capsule Take 1 g by mouth daily.   HYDROcodone-acetaminophen 5-325 MG tablet Commonly known as:  NORCO/VICODIN Take 1-2 tablets by mouth every 4 (four) hours as needed for moderate pain or severe pain.   ibuprofen 200 MG tablet Commonly known as:  ADVIL,MOTRIN Take 200 mg by mouth every 6 (six) hours as needed for moderate pain.   multivitamins ther. w/minerals Tabs tablet Take 1 tablet by mouth daily.   PROBIOTIC DAILY PO Take 1 tablet by mouth daily.   rosuvastatin 10 MG tablet Commonly known as:  CRESTOR Take 10  mg by mouth daily.   SYNTHROID 50 MCG tablet Generic drug:  levothyroxine Take 50 mcg by mouth daily.   vitamin C 500 MG tablet Commonly known as:  ASCORBIC ACID Take 500 mg by mouth daily.       Allergies:  Allergies  Allergen Reactions  . Levaquin [Levofloxacin] Other (See Comments)    Diarrhea/Muscle weakness/inability to lift arms/limbs  . Aspirin Nausea Only and Nausea And Vomiting    Enteric is ok  . Atorvastatin     Other reaction(s): myalgias  . Codeine Nausea Only    Past Medical History, Surgical history, Social history, and Family History were reviewed and updated.  Review of Systems: All other 10 point review of systems is negative.   Physical Exam:  weight is 217 lb (98.4 kg). Her oral temperature is 98.3  F (36.8 C). Her blood pressure is 134/54 (abnormal) and her pulse is 62. Her respiration is 18.   Wt Readings from Last 3 Encounters:  08/04/16 217 lb (98.4 kg)  05/19/16 216 lb 9.6 oz (98.2 kg)  03/19/16 216 lb 6.4 oz (98.2 kg)    Ocular: Sclerae unicteric, pupils equal, round and reactive to light Ear-nose-throat: Oropharynx clear, dentition fair Lymphatic: No cervical supraclavicular or axillary adenopathy Lungs no rales or rhonchi, good excursion bilaterally Heart regular rate and rhythm, no murmur appreciated Abd soft, nontender, positive bowel sounds, no liver or spleen tip palpated on exam, no fluid wave MSK no focal spinal tenderness, no joint edema Neuro: non-focal, well-oriented, appropriate affect Breasts: Deferred  Lab Results  Component Value Date   WBC 9.8 08/04/2016   HGB 16.0 (H) 08/04/2016   HCT 46.2 08/04/2016   MCV 89 08/04/2016   PLT 293 08/04/2016   No results found for: FERRITIN, IRON, TIBC, UIBC, IRONPCTSAT Lab Results  Component Value Date   RBC 5.21 08/04/2016   No results found for: KPAFRELGTCHN, LAMBDASER, KAPLAMBRATIO No results found for: IGGSERUM, IGA, IGMSERUM No results found for: Odetta Pink, SPEI   Chemistry      Component Value Date/Time   NA 143 03/17/2016 0831   NA 142 02/26/2016 0921   K 4.2 03/17/2016 0831   K 4.1 02/26/2016 0921   CL 108 03/17/2016 0831   CL 105 02/26/2015 0807   CO2 28 03/17/2016 0831   CO2 25 02/26/2016 0921   BUN 10 03/17/2016 0831   BUN 18.0 02/26/2016 0921   CREATININE 0.78 03/17/2016 0831   CREATININE 0.8 02/26/2016 0921      Component Value Date/Time   CALCIUM 9.4 03/17/2016 0831   CALCIUM 9.7 02/26/2016 0921   ALKPHOS 63 03/17/2016 0831   ALKPHOS 75 02/26/2016 0921   AST 30 03/17/2016 0831   AST 23 02/26/2016 0921   ALT 27 03/17/2016 0831   ALT 25 02/26/2016 0921   BILITOT 1.5 (H) 03/17/2016 0831   BILITOT 1.62 (H) 02/26/2016 0921      Impression and Plan: Sara Bryan is 76 yo white female with history of a stage IB melanoma of the right arm resection back in November 2012. So far, she has done well and there has been no evidence of recurrence. She continues to do self skin checks at home and follows up with dermatology every 6 months.  Recent PET scan showed no evidence of malignancy and pulmonology will follow-up with another scan in 2 years.  We will get her scheduled for her annual mammogram in the next week or so.  We  will continue to follow along with her and plan to see her back in 1 year for repeat labs and follow-up.  She will contact our office with any questions or concerns. We can certainly see her sooner if need be.   Eliezer Bottom, NP 2/26/20183:35 PM

## 2016-08-05 LAB — LACTATE DEHYDROGENASE: LDH: 214 U/L (ref 125–245)

## 2016-12-12 IMAGING — US US ABDOMEN LIMITED
1 series · 14 of 25 positions shown · non-contrast
Comparison: CT 10/05/2012

CLINICAL DATA: Chest pain.

EXAM:
US ABDOMEN LIMITED - RIGHT UPPER QUADRANT

[Series 1: us abdomen limited · 0.27mm/px · 14 of 37 slices shown]
[im 1/37]
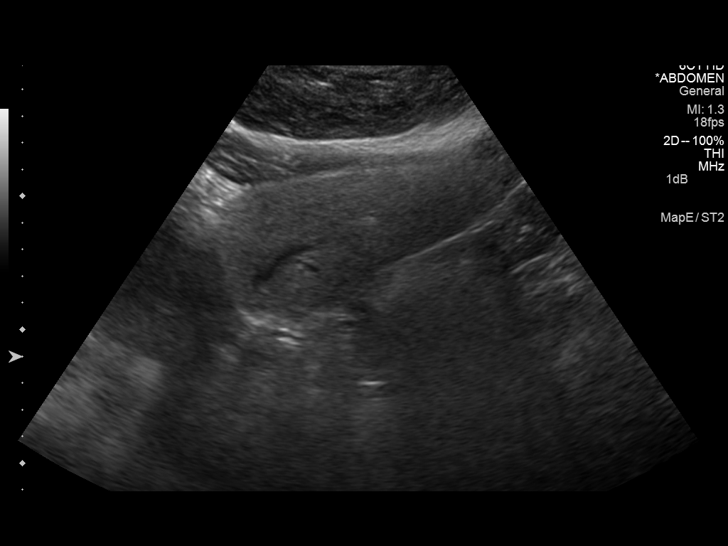
[im 4/37]
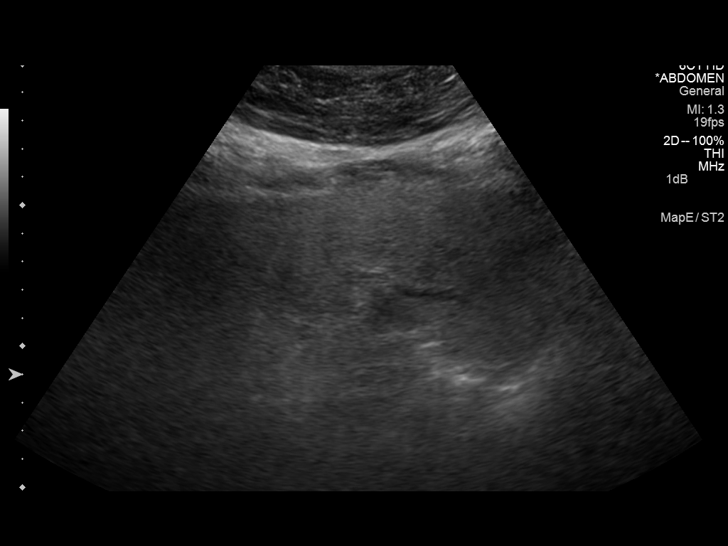
[im 7/37]
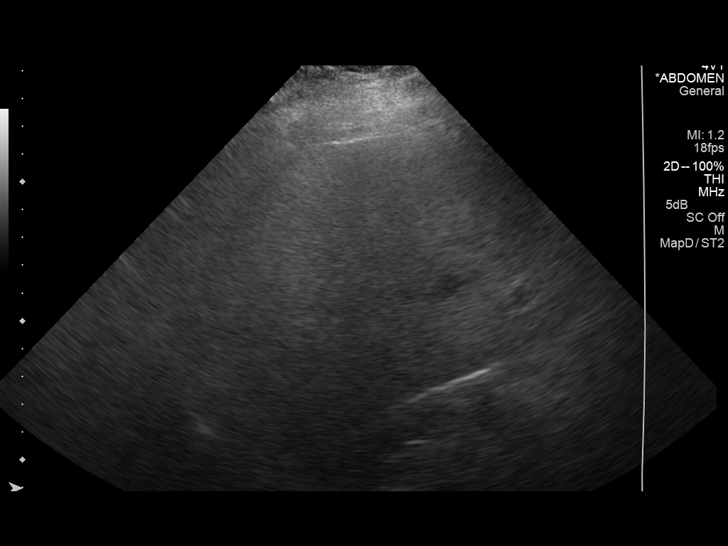
[im 10/37]
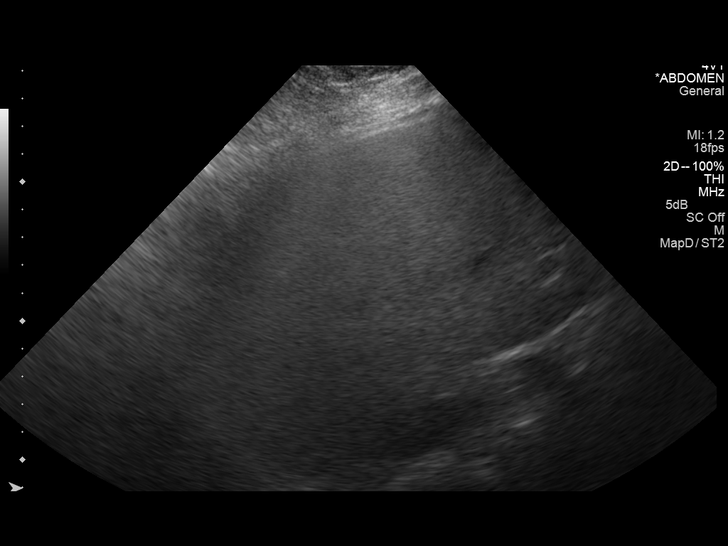
[im 13/37]
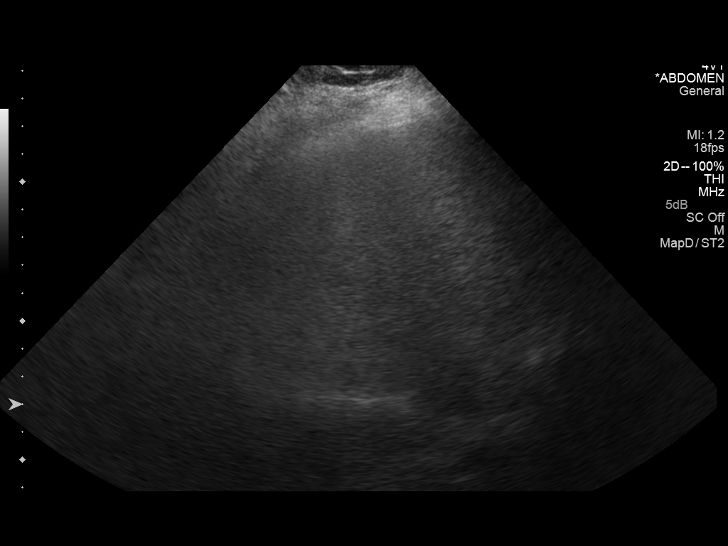
[im 14/37]
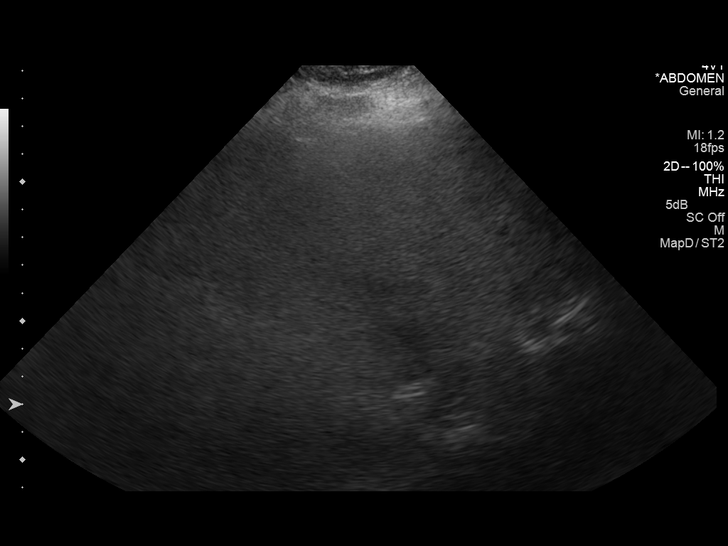
[im 17/37]
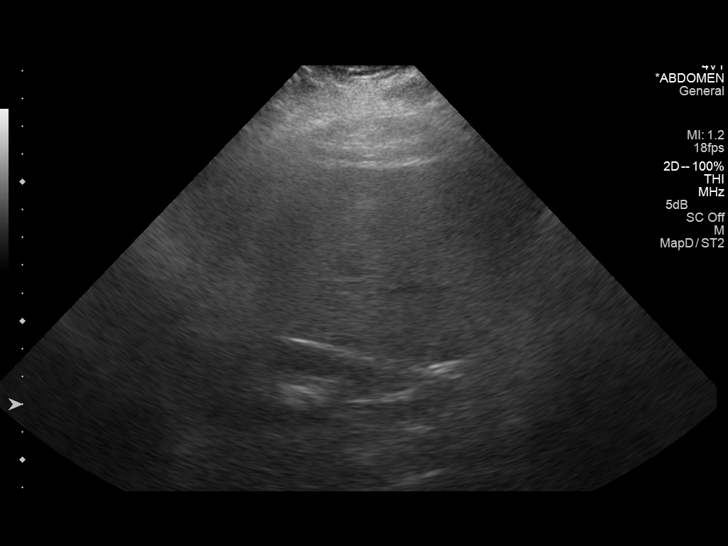
[im 20/37]
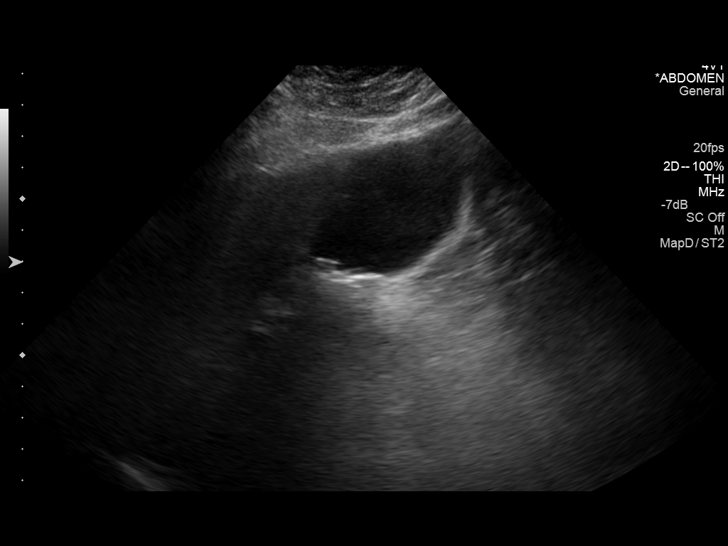
[im 23/37]
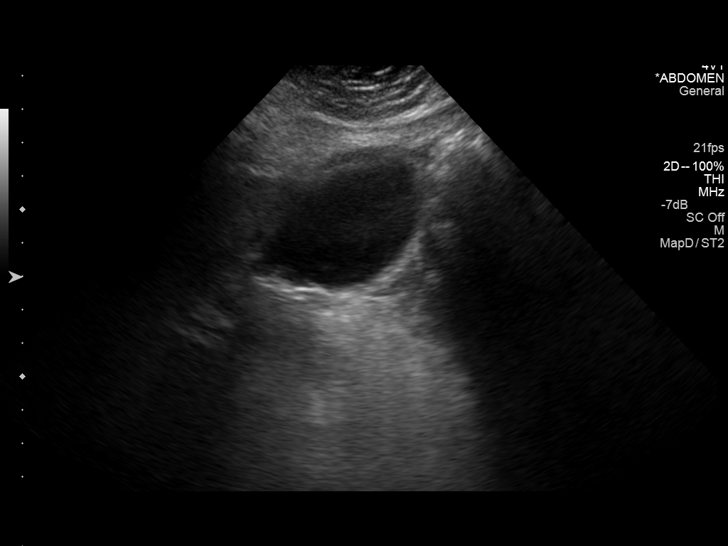
[im 25/37]
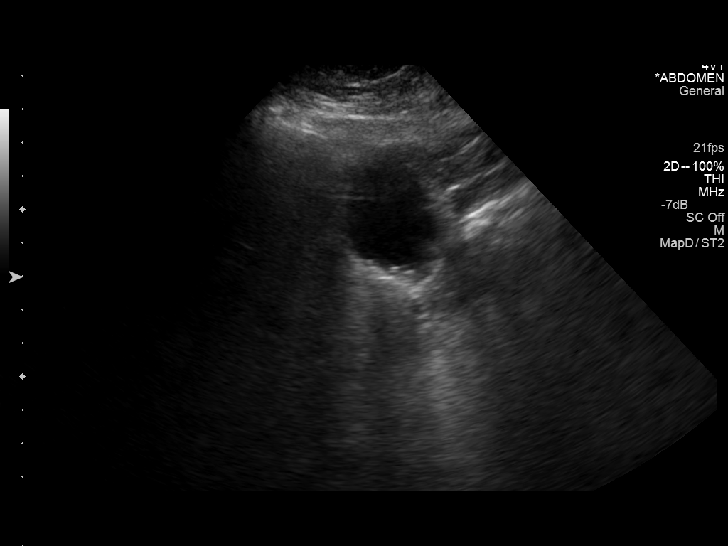
[im 28/37]
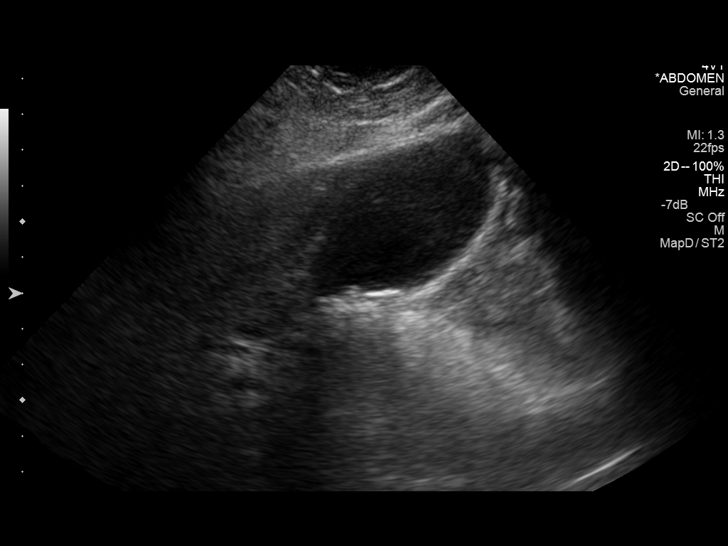
[im 31/37]
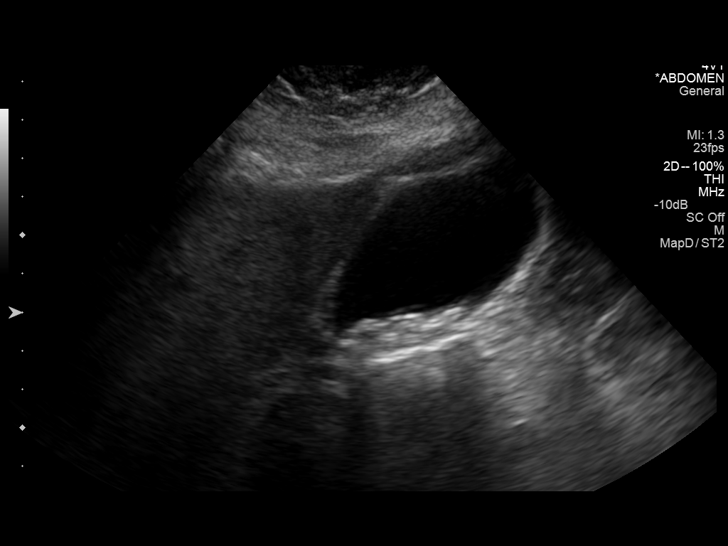
[im 34/37]
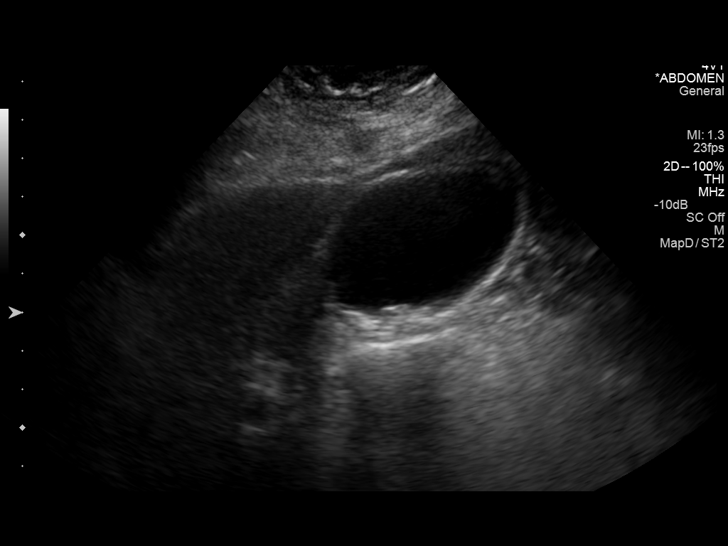
[im 37/37]
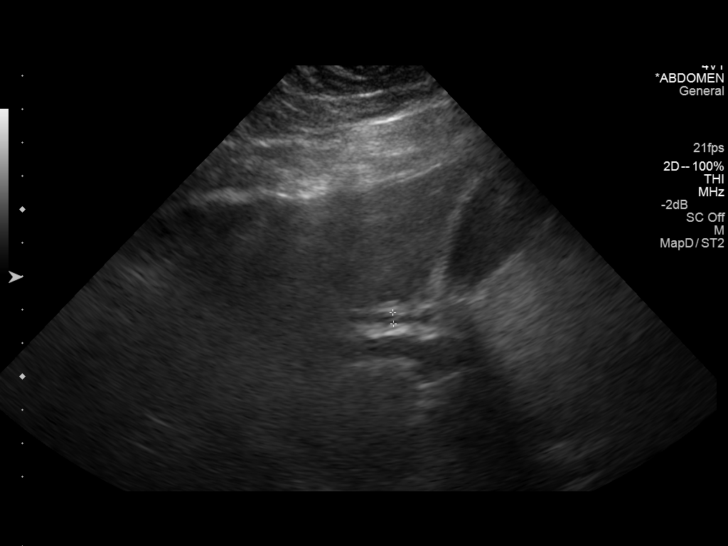

[14 of 25 positions shown; findings below may reference images not displayed]

FINDINGS: Gallbladder:

Multiple shadowing gallstones. No wall thickening visualized. No
sonographic Murphy sign noted.

Common bile duct:

Diameter: 3.4 mm, normal.

Liver:

No focal lesion identified. Diffusely increased and coarsened in
parenchymal echogenicity, difficult to penetrate.
IMPRESSION: 1. Cholelithiasis without findings of cholecystitis or biliary
dilatation.
2. Hepatic steatosis.

## 2017-08-03 ENCOUNTER — Encounter: Payer: Self-pay | Admitting: Hematology & Oncology

## 2017-08-03 ENCOUNTER — Inpatient Hospital Stay: Payer: Medicare Other | Attending: Hematology & Oncology

## 2017-08-03 ENCOUNTER — Inpatient Hospital Stay (HOSPITAL_BASED_OUTPATIENT_CLINIC_OR_DEPARTMENT_OTHER): Payer: Medicare Other | Admitting: Hematology & Oncology

## 2017-08-03 ENCOUNTER — Other Ambulatory Visit: Payer: Self-pay

## 2017-08-03 VITALS — BP 145/61 | HR 69 | Temp 97.7°F | Resp 20 | Wt 222.0 lb

## 2017-08-03 DIAGNOSIS — M818 Other osteoporosis without current pathological fracture: Secondary | ICD-10-CM | POA: Diagnosis not present

## 2017-08-03 DIAGNOSIS — C4361 Malignant melanoma of right upper limb, including shoulder: Secondary | ICD-10-CM | POA: Diagnosis not present

## 2017-08-03 DIAGNOSIS — Z7982 Long term (current) use of aspirin: Secondary | ICD-10-CM | POA: Insufficient documentation

## 2017-08-03 LAB — COMPREHENSIVE METABOLIC PANEL
ALBUMIN: 3.8 g/dL (ref 3.5–5.0)
ALK PHOS: 78 U/L (ref 40–150)
ALT: 24 U/L (ref 0–55)
AST: 27 U/L (ref 5–34)
Anion gap: 9 (ref 3–11)
BILIRUBIN TOTAL: 1.5 mg/dL — AB (ref 0.2–1.2)
BUN: 22 mg/dL (ref 7–26)
CALCIUM: 9.8 mg/dL (ref 8.4–10.4)
CO2: 25 mmol/L (ref 22–29)
CREATININE: 0.83 mg/dL (ref 0.60–1.10)
Chloride: 105 mmol/L (ref 98–109)
GFR calc Af Amer: >60 mL/min (ref >60)
GFR calc non Af Amer: >60 mL/min (ref >60)
Glucose, Bld: 103 mg/dL (ref 70–140)
Potassium: 4.4 mmol/L (ref 3.5–5.1)
Sodium: 139 mmol/L (ref 136–145)
TOTAL PROTEIN: 8 g/dL (ref 6.4–8.3)

## 2017-08-03 LAB — CBC WITH DIFFERENTIAL (CANCER CENTER ONLY)
BASOS PCT: 0 %
Basophils Absolute: 0 10*3/uL (ref 0.0–0.1)
EOS ABS: 0.2 10*3/uL (ref 0.0–0.5)
EOS PCT: 2 %
HCT: 46.7 % — ABNORMAL HIGH (ref 34.8–46.6)
Hemoglobin: 15.6 g/dL (ref 11.6–15.9)
LYMPHS PCT: 26 %
Lymphs Abs: 2.4 10*3/uL (ref 0.9–3.3)
MCH: 30.4 pg (ref 26.0–34.0)
MCHC: 33.4 g/dL (ref 32.0–36.0)
MCV: 91 fL (ref 81.0–101.0)
Monocytes Absolute: 1.1 10*3/uL — ABNORMAL HIGH (ref 0.1–0.9)
Monocytes Relative: 12 %
Neutro Abs: 5.4 10*3/uL (ref 1.5–6.5)
Neutrophils Relative %: 60 %
PLATELETS: 281 10*3/uL (ref 145–400)
RBC: 5.13 MIL/uL (ref 3.70–5.32)
RDW: 13.4 % (ref 11.1–15.7)
WBC Count: 9.1 10*3/uL (ref 3.9–10.0)

## 2017-08-03 LAB — LACTATE DEHYDROGENASE: LDH: 225 U/L (ref 125–245)

## 2017-08-03 NOTE — Progress Notes (Signed)
Hematology and Oncology Follow Up Visit  Sara Bryan [id number] [birth year] 77 y.o. 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  We see her once a year.  So far, everything is been doing well.  She was having some breathing issues.  This seems to have gotten better.    We have been looking at some nodule on her left lung.  So far, there is been no issues with this.  It is not grown.  She did have a mammogram done last month.  She is doing well and has no complaints at this time.  She is over due for her annual mammogram.  She said this was negative.  She has had no fever.  She has had no bleeding.  There is been no shortness of breath.  She has had no cough.  Overall, as it performance status is ECOG 0.  She is still working.  She enjoys work.    Medications:  Allergies as of 08/03/2017      Reactions   Levaquin [levofloxacin] Other (See Comments)   Diarrhea/Muscle weakness/inability to lift arms/limbs   Aspirin Nausea Only, Nausea And Vomiting   Enteric is ok   Atorvastatin    Other reaction(s): myalgias   Codeine Nausea Only      Medication List        Accurate as of 08/03/17  8:29 AM. Always use your most recent med list.          acetaminophen 500 MG tablet Commonly known as:  TYLENOL Take 500 mg by mouth every 6 (six) hours as needed for mild pain.   aspirin EC 81 MG tablet Take 81 mg by mouth daily.   b complex vitamins tablet Take 1 tablet by mouth daily.   CALCIUM PO Take 1,000 mg by mouth daily.   cetirizine 10 MG tablet Commonly known as:  ZYRTEC Take 10 mg by mouth daily as needed for allergies.   CHOLECALCIFEROL PO Take 6,000 Units by mouth daily.   CO Q-10 PO Take 100 mg by mouth daily.   fish oil-omega-3 fatty acids 1000 MG capsule Take 1 g by mouth daily.   HYDROcodone-acetaminophen 5-325  MG tablet Commonly known as:  NORCO/VICODIN Take 1-2 tablets by mouth every 4 (four) hours as needed for moderate pain or severe pain.   ibuprofen 200 MG tablet Commonly known as:  ADVIL,MOTRIN Take 200 mg by mouth every 6 (six) hours as needed for moderate pain.   multivitamins ther. w/minerals Tabs tablet Take 1 tablet by mouth daily.   PROBIOTIC DAILY PO Take 1 tablet by mouth daily.   rosuvastatin 10 MG tablet Commonly known as:  CRESTOR Take 10 mg by mouth daily.   SYNTHROID 50 MCG tablet Generic drug:  levothyroxine Take 50 mcg by mouth daily.   vitamin C 500 MG tablet Commonly known as:  ASCORBIC ACID Take 500 mg by mouth daily.       Allergies:  Allergies  Allergen Reactions  . Levaquin [Levofloxacin] Other (See Comments)    Diarrhea/Muscle weakness/inability to lift arms/limbs  . Aspirin Nausea Only and Nausea And Vomiting    Enteric is ok  . Atorvastatin     Other reaction(s): myalgias  . Codeine Nausea Only    Past Medical History, Surgical history, Social history, and Family History were reviewed and updated.  Review of Systems: Review of Systems  Constitutional: Negative.   HENT: Negative.   Eyes: Negative.   Respiratory: Negative.   Cardiovascular: Negative.   Gastrointestinal: Positive for heartburn.  Genitourinary: Negative.   Musculoskeletal: Negative.   Skin: Negative.   Neurological: Negative.   Endo/Heme/Allergies: Negative.   Psychiatric/Behavioral: Negative.     Physical Exam:  weight is 222 lb (100.7 kg). Her oral temperature is 97.7 F (36.5 C). Her blood pressure is 145/61 (abnormal) and her pulse is 69. Her respiration is 20 and oxygen saturation is 98%.   Wt Readings from Last 3 Encounters:  08/03/17 222 lb (100.7 kg)  08/04/16 217 lb (98.4 kg)  05/19/16 216 lb 9.6 oz (98.2 kg)    Physical Exam  Constitutional: She is oriented to person, place, and time.  HENT:  Head: Normocephalic and atraumatic.  Mouth/Throat:  Oropharynx is clear and moist.  Eyes: EOM are normal. Pupils are equal, round, and reactive to light.  Neck: Normal range of motion.  Cardiovascular: Normal rate, regular rhythm and normal heart sounds.  Pulmonary/Chest: Effort normal and breath sounds normal.  Abdominal: Soft. Bowel sounds are normal.  Musculoskeletal: Normal range of motion. She exhibits no edema, tenderness or deformity.  Lymphadenopathy:    She has no cervical adenopathy.  Neurological: She is alert and oriented to person, place, and time.  Skin: Skin is warm and dry. No rash noted. No erythema.  Psychiatric: She has a normal mood and affect. Her behavior is normal. Judgment and thought content normal.  Vitals reviewed.    Lab Results  Component Value Date   WBC 9.1 08/03/2017   HGB 16.0 (H) 08/04/2016   HCT 46.7 (H) 08/03/2017   MCV 91.0 08/03/2017   PLT 281 08/03/2017   No results found for: FERRITIN, IRON, TIBC, UIBC, IRONPCTSAT Lab Results  Component Value Date   RBC 5.13 08/03/2017   No results found for: KPAFRELGTCHN, LAMBDASER, KAPLAMBRATIO No results found for: IGGSERUM, IGA, IGMSERUM No results found for: Kathrynn Ducking, MSPIKE, SPEI   Chemistry      Component Value Date/Time   NA 133 (L) 08/04/2016 1459   NA 142 02/26/2016 0921   K 4.0 08/04/2016 1459   K 4.1 02/26/2016 0921   CL 103 08/04/2016 1459   CL 105 02/26/2015 0807   CO2 26 08/04/2016 1459   CO2 25 02/26/2016 0921   BUN 18 08/04/2016 1459   BUN 18.0 02/26/2016 0921   CREATININE 0.64 08/04/2016 1459   CREATININE 0.8 02/26/2016 0921      Component Value Date/Time   CALCIUM 9.4 08/04/2016 1459   CALCIUM 9.7 02/26/2016 0921   ALKPHOS 80 08/04/2016 1459   ALKPHOS 75 02/26/2016 0921   AST 19 08/04/2016 1459   AST 23 02/26/2016 0921   ALT 19 08/04/2016 1459   ALT 25 02/26/2016 0921   BILITOT 1.0 08/04/2016 1459   BILITOT 1.62 (H) 02/26/2016 0921     Impression and Plan: Ms.  Bryan is 77 yo white female with history of a stage IB melanoma of the right arm resection back in November 2012. So far, she has done well and there has been no evidence of recurrence.   She continues to do self skin checks at home and follows up with dermatology every 6 months.   We will plan to get her back in 1 year.  I do not see that we have to do any x-rays on her.   Mention  [name], MD 2/25/20198:29 AM

## 2018-02-09 ENCOUNTER — Ambulatory Visit (INDEPENDENT_AMBULATORY_CARE_PROVIDER_SITE_OTHER)
Admission: RE | Admit: 2018-02-09 | Discharge: 2018-02-09 | Disposition: A | Discharge status: Home / Self Care | Payer: Medicare Other | Source: Ambulatory Visit | Attending: Pulmonary Disease | Admitting: Pulmonary Disease

## 2018-02-09 DIAGNOSIS — R918 Other nonspecific abnormal finding of lung field: Secondary | ICD-10-CM | POA: Diagnosis not present

## 2018-02-16 ENCOUNTER — Ambulatory Visit: Payer: Medicare Other | Admitting: Pulmonary Disease

## 2018-02-16 ENCOUNTER — Encounter: Payer: Self-pay | Admitting: Pulmonary Disease

## 2018-02-16 DIAGNOSIS — Z23 Encounter for immunization: Secondary | ICD-10-CM

## 2018-02-16 DIAGNOSIS — R911 Solitary pulmonary nodule: Secondary | ICD-10-CM | POA: Diagnosis not present

## 2018-02-16 NOTE — Progress Notes (Signed)
BOWEN GOYAL    [id number]    Aug 27, 1940  Primary Care Physician:Swayne, Shanon Brow, MD  Referring Physician: Antony Contras, MD [place], [place] [address]  Chief complaint: Follow up for abnormal CT scan  HPI: Mrs. Krasinski is a 77 year old with past medical history of melanoma status post resection. She has been followed by Dr. Marin Olp with no evidence of recurrence. She had a CT chest to evaluate chest pain and was found to have a left upper lobe nodule. This was seen earlier in 2007 at 3 mm and now is 9 mm. She also has a stable left lower lobe calcified nodule. She denies any primary symptoms of cough, sputum production, fevers, chills, hemoptysis, loss of weight, loss of appetite.  Interim history:  Here for review of CT scan Feels well with no complaints.  Physical Exam: Blood pressure 128/76, pulse 82, weight 194 lb 12.8 oz (88.4 kg), SpO2 98 %. Gen:      No acute distress HEENT:  EOMI, sclera anicteric Neck:     No masses; no thyromegaly Lungs:    Clear to auscultation bilaterally; normal respiratory effort CV:         Regular rate and rhythm; no murmurs Abd:      + bowel sounds; soft, non-tender; no palpable masses, no distension Ext:    No edema; adequate peripheral perfusion Skin:      Warm and dry; no rash Neuro: alert and oriented x 3 Psych: normal mood and affect  Data Reviewed: CT scan 02/20/16- RUL nodule 9 mmm. RLL stable calcified nodule CT scan 05/19/06- Right upper lobe 3 mm nodule, right lower lobe nodule. PET scan 02/20/16- no PET uptake. CT 02/09/2018- majority of the pulmonary nodules are stable.  New 3 mm nodule in the right major fissure.  Area of scarring in the left lower lobe looks more prominent. All images reviewed  Assessment:  Eval for lung nodule. CT scan images from 2007 and 2019 reviewed.  Most of the nodules are stable.  Favor benign etiology However given her history of melanoma and the finding of new  small subcentimeter nodule we will order a repeat CT in 6 months.  Plan/Recommendations: - Repeat CT in 6 months.  Marshell Garfinkel MD [facility] 02/16/2018, 4:01 PM  Outpatient Encounter Medications as of 02/16/2018  Medication Sig  . acetaminophen (TYLENOL) 500 MG tablet Take 500 mg by mouth every 6 (six) hours as needed for mild pain.  Marland Kitchen aspirin EC 81 MG tablet Take 81 mg by mouth daily.  Marland Kitchen b complex vitamins tablet Take 1 tablet by mouth daily.    Marland Kitchen CALCIUM PO Take 1,000 mg by mouth daily.   . cetirizine (ZYRTEC) 10 MG tablet Take 10 mg by mouth daily as needed for allergies.  . CHOLECALCIFEROL PO Take 6,000 Units by mouth daily.  . Coenzyme Q10 (CO Q-10 PO) Take 100 mg by mouth daily.   . fish oil-omega-3 fatty acids 1000 MG capsule Take 1 g by mouth daily.    Marland Kitchen HYDROcodone-acetaminophen (NORCO/VICODIN) 5-325 MG tablet Take 1-2 tablets by mouth every 4 (four) hours as needed for moderate pain or severe pain.  Marland Kitchen ibuprofen (ADVIL,MOTRIN) 200 MG tablet Take 200 mg by mouth every 6 (six) hours as needed for moderate pain.  Marland Kitchen levothyroxine (SYNTHROID) 50 MCG tablet Take 50 mcg by mouth daily.  . Multiple Vitamins-Minerals (MULTIVITAMINS THER. W/MINERALS) TABS Take 1 tablet by  mouth daily.    . Probiotic Product (PROBIOTIC DAILY PO) Take 1 tablet by mouth daily.   . rosuvastatin (CRESTOR) 10 MG tablet Take 10 mg by mouth daily.   . vitamin C (ASCORBIC ACID) 500 MG tablet Take 500 mg by mouth daily.     No facility-administered encounter medications on file as of 02/16/2018.     Allergies as of 02/16/2018 - Review Complete 02/16/2018  Allergen Reaction Noted  . Levaquin [levofloxacin] Other (See Comments) 02/27/2014  . Aspirin Nausea Only and Nausea And Vomiting 01/23/2011  . Atorvastatin  12/10/2015  . Codeine Nausea Only 12/10/2015    Past Medical History:  Diagnosis Date  . Arthritis   . Cancer ([place])    Melonoma  . 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 Complication of anesthesia    was aware of c section going on.  . Hernia   . History of hiatal hernia   . Hyperlipidemia   . Supraventricular tachycardia ([place]) 2007   once- cardiologist- dr Acie Fredrickson; yearly  . Varicose veins     Past Surgical History:  Procedure Laterality Date  . CESAREAN SECTION    . CHOLECYSTECTOMY N/A 03/19/2016   Procedure: LAPAROSCOPIC CHOLECYSTECTOMY WITH INTRAOPERATIVE CHOLANGIOGRAM;  Surgeon: Fanny Skates, MD;  Location: [facility];  Service: General;  Laterality: N/A;  . [place]   abd wall  . HERNIA REPAIR    . HERNIA REPAIR    . HERNIA REPAIR    . MELANOMA EXCISION     from back in 2007    Family History  Problem Relation Age of Onset  . Cancer Mother        Breast  . Arthritis Father        RA  . Heart disease Father        cardiomegaly    Social History   Socioeconomic History  . Marital status: Widowed    Spouse name: Not on file  . Number of children: Not on file  . Years of education: Not on file  . Highest education level: Not on file  Occupational History  . Not on file  Social Needs  . Financial resource strain: Not on file  . Food insecurity:    Worry: Not on file    Inability: Not on file  . Transportation needs:    Medical: Not on file    Non-medical: Not on file  Tobacco Use  . Smoking status: Former Smoker    Packs/day: 0.50    Years: 20.00    Pack years: 10.00    Types: Cigarettes    Start date: 08/30/1959    Last attempt to quit: 03/15/1991    Years since quitting: 26.9  . Smokeless tobacco: Never Used  . Tobacco comment: quit 33 years ago  Substance and Sexual Activity  . Alcohol use: No    Alcohol/week: 0.0 standard drinks  . Drug use: No  . Sexual activity: Never    Birth control/protection: Post-menopausal  Lifestyle  . Physical activity:    Days per week: Not on file    Minutes per session: Not on file  . Stress: Not on file    Relationships  . Social connections:    Talks on phone: Not on file    Gets together: Not on file    Attends religious service: Not on file    Active member of club or organization: Not on file    Attends meetings of clubs or  organizations: Not on file    Relationship status: Not on file  . Intimate partner violence:    Fear of current or ex partner: Not on file    Emotionally abused: Not on file    Physically abused: Not on file    Forced sexual activity: Not on file  Other Topics Concern  . Not on file  Social History Narrative   Widowed   Children:2   Lives alone   Works at the [employer]   Review of systems: Review of Systems  Constitutional: Negative for fever and chills.  HENT: Negative.   Eyes: Negative for blurred vision.  Respiratory: as per HPI  Cardiovascular: Negative for chest pain and palpitations.  Gastrointestinal: Negative for vomiting, diarrhea, blood per rectum. Genitourinary: Negative for dysuria, urgency, frequency and hematuria.  Musculoskeletal: Negative for myalgias, back pain and joint pain.  Skin: Negative for itching and rash.  Neurological: Negative for dizziness, tremors, focal weakness, seizures and loss of consciousness.  Endo/Heme/Allergies: Negative for environmental allergies.  Psychiatric/Behavioral: Negative for depression, suicidal ideas and hallucinations.  All other systems reviewed and are negative.  Marshell Garfinkel MD [facility] 02/19/2018, 5:48 AM CC: Antony Contras, MD

## 2018-02-16 NOTE — Patient Instructions (Signed)
I have reviewed his CT scan which shows that most of the lung nodules are stable which is good news.  There was one area where there is a small new nodule which also looks benign.  However we would get a repeat CT in 6 months to make sure there is no change Follow-up after CT for review.

## 2018-06-29 ENCOUNTER — Other Ambulatory Visit: Payer: Self-pay | Admitting: Family Medicine

## 2018-06-29 ENCOUNTER — Ambulatory Visit
Admission: RE | Admit: 2018-06-29 | Discharge: 2018-06-29 | Disposition: A | Discharge status: Home / Self Care | Payer: Medicare Other | Source: Ambulatory Visit | Attending: Family Medicine | Admitting: Family Medicine

## 2018-06-29 DIAGNOSIS — M25461 Effusion, right knee: Secondary | ICD-10-CM

## 2018-08-02 ENCOUNTER — Inpatient Hospital Stay (HOSPITAL_BASED_OUTPATIENT_CLINIC_OR_DEPARTMENT_OTHER): Payer: Medicare Other | Admitting: Hematology & Oncology

## 2018-08-02 ENCOUNTER — Encounter: Payer: Self-pay | Admitting: Hematology & Oncology

## 2018-08-02 ENCOUNTER — Other Ambulatory Visit: Payer: Self-pay

## 2018-08-02 ENCOUNTER — Inpatient Hospital Stay: Payer: Medicare Other | Attending: Hematology & Oncology

## 2018-08-02 VITALS — BP 135/64 | HR 63 | Temp 98.1°F | Resp 18 | Wt 193.8 lb

## 2018-08-02 DIAGNOSIS — M818 Other osteoporosis without current pathological fracture: Secondary | ICD-10-CM

## 2018-08-02 DIAGNOSIS — C4361 Malignant melanoma of right upper limb, including shoulder: Secondary | ICD-10-CM | POA: Diagnosis not present

## 2018-08-02 LAB — CMP (CANCER CENTER ONLY)
ALBUMIN: 4.1 g/dL (ref 3.5–5.0)
ALK PHOS: 82 U/L (ref 38–126)
ALT: 21 U/L (ref 0–44)
AST: 20 U/L (ref 15–41)
Anion gap: 7 (ref 5–15)
BILIRUBIN TOTAL: 1.2 mg/dL (ref 0.3–1.2)
BUN: 21 mg/dL (ref 8–23)
CALCIUM: 9.5 mg/dL (ref 8.9–10.3)
CO2: 32 mmol/L (ref 22–32)
CREATININE: 0.75 mg/dL (ref 0.44–1.00)
Chloride: 100 mmol/L (ref 98–111)
GFR, Est AFR Am: >60 mL/min (ref >60)
GLUCOSE: 108 mg/dL — AB (ref 70–99)
POTASSIUM: 4 mmol/L (ref 3.5–5.1)
Sodium: 139 mmol/L (ref 135–145)
TOTAL PROTEIN: 7 g/dL (ref 6.5–8.1)

## 2018-08-02 LAB — CBC WITH DIFFERENTIAL (CANCER CENTER ONLY)
ABS IMMATURE GRANULOCYTES: 0.02 10*3/uL (ref 0.00–0.07)
BASOS ABS: 0.1 10*3/uL (ref 0.0–0.1)
Basophils Relative: 1 %
EOS ABS: 0.2 10*3/uL (ref 0.0–0.5)
Eosinophils Relative: 3 %
HCT: 47.4 % — ABNORMAL HIGH (ref 36.0–46.0)
Hemoglobin: 15.6 g/dL — ABNORMAL HIGH (ref 12.0–15.0)
IMMATURE GRANULOCYTES: 0 %
LYMPHS ABS: 2.4 10*3/uL (ref 0.7–4.0)
LYMPHS PCT: 32 %
MCH: 31.8 pg (ref 26.0–34.0)
MCHC: 32.9 g/dL (ref 30.0–36.0)
MCV: 96.7 fL (ref 80.0–100.0)
MONOS PCT: 10 %
Monocytes Absolute: 0.7 10*3/uL (ref 0.1–1.0)
NEUTROS ABS: 4 10*3/uL (ref 1.7–7.7)
NEUTROS PCT: 54 %
NRBC: 0 % (ref 0.0–0.2)
Platelet Count: 265 10*3/uL (ref 150–400)
RBC: 4.9 MIL/uL (ref 3.87–5.11)
RDW: 13.3 % (ref 11.5–15.5)
WBC Count: 7.4 10*3/uL (ref 4.0–10.5)

## 2018-08-02 LAB — LACTATE DEHYDROGENASE: LDH: 229 U/L — ABNORMAL HIGH (ref 98–192)

## 2018-08-02 NOTE — Progress Notes (Signed)
Hematology and Oncology Follow Up Visit  Sara Bryan [id number] Jul 06, 1940 78 y.o. 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  She typically comes back yearly to see [place].  She has been doing well.  She has a dressing on her left wrist.  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  Otherwise, she has had no problems over the past year.  She has been pretty healthy.  She is overdue for her mammogram.  She will get this next month.  She has had no issues with swelling of the right arm.  There is been no issues with lymphedema.  She has had no change in bowel or bladder habits.  Is heartily that she is 78 years old.  She is still working.  She still enjoys this.  She has had no cough or shortness of breath.  She has what looks like some kind of viral conjunctivitis.  She is on eyedrops for this.  Overall, her performance status is ECOG 1.     Medications:  Allergies as of 08/02/2018      Reactions   Levaquin [levofloxacin] Other (See Comments)   Diarrhea/Muscle weakness/inability to lift arms/limbs   Aspirin Nausea Only, Nausea And Vomiting   Enteric is ok   Atorvastatin    Other reaction(s): myalgias   Codeine Nausea Only      Medication List       Accurate as of August 02, 2018  8:20 AM. Always use your most recent med list.        acetaminophen 500 MG tablet Commonly known as:  TYLENOL Take 500 mg by mouth every 6 (six) hours as needed for mild pain.   aspirin EC 81 MG tablet Take 81 mg by mouth daily.   b complex vitamins tablet Take 1 tablet by mouth daily.   CALCIUM PO Take 1,000 mg by mouth daily.   cetirizine 10 MG tablet Commonly known as:  ZYRTEC Take 10 mg by mouth daily as needed for allergies.   CHOLECALCIFEROL PO Take 6,000 Units by mouth daily.   CO Q-10  PO Take 100 mg by mouth daily.   fish oil-omega-3 fatty acids 1000 MG capsule Take 1 g by mouth daily.   fluorometholone 0.1 % ophthalmic suspension Commonly known as:  FML INSTILL 1 DROP TWICE DAILY INTO RIGHT EYE   HYDROcodone-acetaminophen 5-325 MG tablet Commonly known as:  NORCO/VICODIN Take 1-2 tablets by mouth every 4 (four) hours as needed for moderate pain or severe pain.   ibuprofen 200 MG tablet Commonly known as:  ADVIL,MOTRIN Take 200 mg by mouth every 6 (six) hours as needed for moderate pain.   multivitamins ther. w/minerals Tabs tablet Take 1 tablet by mouth daily.   PROBIOTIC DAILY PO Take 1 tablet by mouth daily.   rosuvastatin 10 MG tablet Commonly known as:  CRESTOR Take 10 mg by mouth daily.   SYNTHROID 50 MCG tablet Generic drug:  levothyroxine Take 50 mcg by mouth daily.   valACYclovir 1000 MG tablet Commonly known as:  VALTREX Take 1,000 mg by mouth daily.   vitamin C 500 MG tablet Commonly known as:  ASCORBIC ACID Take 500 mg by mouth daily.       Allergies:  Allergies  Allergen Reactions  . Levaquin [Levofloxacin] Other (See Comments)  Diarrhea/Muscle weakness/inability to lift arms/limbs  . Aspirin Nausea Only and Nausea And Vomiting    Enteric is ok  . Atorvastatin     Other reaction(s): myalgias  . Codeine Nausea Only    Past Medical History, Surgical history, Social history, and Family History were reviewed and updated.  Review of Systems: Review of Systems  Constitutional: Negative.   HENT: Negative.   Eyes: Negative.   Respiratory: Negative.   Cardiovascular: Negative.   Gastrointestinal: Positive for heartburn.  Genitourinary: Negative.   Musculoskeletal: Negative.   Skin: Negative.   Neurological: Negative.   Endo/Heme/Allergies: Negative.   Psychiatric/Behavioral: Negative.     Physical Exam:  weight is 193 lb 12 oz (87.9 kg). Her oral temperature is 98.1 F (36.7 C). Her blood pressure is 135/64 and her  pulse is 63. Her respiration is 18 and oxygen saturation is 99%.   Wt Readings from Last 3 Encounters:  08/02/18 193 lb 12 oz (87.9 kg)  02/16/18 194 lb 12.8 oz (88.4 kg)  08/03/17 222 lb (100.7 kg)    Physical Exam Vitals signs reviewed.  HENT:     Head: Normocephalic and atraumatic.  Eyes:     Pupils: Pupils are equal, round, and reactive to light.  Neck:     Musculoskeletal: Normal range of motion.  Cardiovascular:     Rate and Rhythm: Normal rate and regular rhythm.     Heart sounds: Normal heart sounds.  Pulmonary:     Effort: Pulmonary effort is normal.     Breath sounds: Normal breath sounds.  Abdominal:     General: Bowel sounds are normal.     Palpations: Abdomen is soft.  Musculoskeletal: Normal range of motion.        General: No tenderness or deformity.  Lymphadenopathy:     Cervical: No cervical adenopathy.  Skin:    General: Skin is warm and dry.     Findings: No erythema or rash.  Neurological:     Mental Status: She is alert and oriented to person, place, and time.  Psychiatric:        Behavior: Behavior normal.        Thought Content: Thought content normal.        Judgment: Judgment normal.      Lab Results  Component Value Date   WBC 7.4 08/02/2018   HGB 15.6 (H) 08/02/2018   HCT 47.4 (H) 08/02/2018   MCV 96.7 08/02/2018   PLT 265 08/02/2018   No results found for: FERRITIN, IRON, TIBC, UIBC, IRONPCTSAT Lab Results  Component Value Date   RBC 4.90 08/02/2018   No results found for: KPAFRELGTCHN, LAMBDASER, KAPLAMBRATIO No results found for: IGGSERUM, IGA, IGMSERUM No results found for: Odetta Pink, SPEI   Chemistry      Component Value Date/Time   NA 139 08/03/2017 0801   NA 133 (L) 08/04/2016 1459   NA 142 02/26/2016 0921   K 4.4 08/03/2017 0801   K 4.0 08/04/2016 1459   K 4.1 02/26/2016 0921   CL 105 08/03/2017 0801   CL 103 08/04/2016 1459   CL 105 02/26/2015 0807   CO2  25 08/03/2017 0801   CO2 26 08/04/2016 1459   CO2 25 02/26/2016 0921   BUN 22 08/03/2017 0801   BUN 18 08/04/2016 1459   BUN 18.0 02/26/2016 0921   CREATININE 0.83 08/03/2017 0801   CREATININE 0.64 08/04/2016 1459   CREATININE 0.8 02/26/2016 2952  Component Value Date/Time   CALCIUM 9.8 08/03/2017 0801   CALCIUM 9.4 08/04/2016 1459   CALCIUM 9.7 02/26/2016 0921   ALKPHOS 78 08/03/2017 0801   ALKPHOS 80 08/04/2016 1459   ALKPHOS 75 02/26/2016 0921   AST 27 08/03/2017 0801   AST 19 08/04/2016 1459   AST 23 02/26/2016 0921   ALT 24 08/03/2017 0801   ALT 19 08/04/2016 1459   ALT 25 02/26/2016 0921   BILITOT 1.5 (H) 08/03/2017 0801   BILITOT 1.0 08/04/2016 1459   BILITOT 1.62 (H) 02/26/2016 0921     Impression and Plan: Sara Bryan is 78 yo white female with history of a stage IB melanoma of the right arm resection back in November 2012. So far, she has done well and there has been no evidence of recurrence.   She continues to do self skin checks at home and follows up with dermatology every 6 months.   We will plan to get her back in 1 year.  I do not see that we have to do any x-rays on her.     [name], MD 2/24/20208:20 AM

## 2018-08-03 ENCOUNTER — Other Ambulatory Visit: Payer: Self-pay | Admitting: *Deleted

## 2018-08-03 LAB — VITAMIN D 25 HYDROXY (VIT D DEFICIENCY, FRACTURES): Vit D, 25-Hydroxy: 60.8 ng/mL (ref 30.0–100.0)

## 2018-08-03 NOTE — Addendum Note (Signed)
Addended by: [name] on: 08/03/2018 07:19 AM   Modules accepted: Orders

## 2018-08-23 ENCOUNTER — Other Ambulatory Visit: Payer: Self-pay

## 2018-08-23 ENCOUNTER — Ambulatory Visit (INDEPENDENT_AMBULATORY_CARE_PROVIDER_SITE_OTHER)
Admission: RE | Admit: 2018-08-23 | Discharge: 2018-08-23 | Disposition: A | Discharge status: Home / Self Care | Payer: Medicare Other | Source: Ambulatory Visit | Attending: Pulmonary Disease | Admitting: Pulmonary Disease

## 2018-08-23 DIAGNOSIS — R911 Solitary pulmonary nodule: Secondary | ICD-10-CM

## 2018-08-25 ENCOUNTER — Telehealth: Payer: Self-pay | Admitting: Pulmonary Disease

## 2018-08-25 DIAGNOSIS — R911 Solitary pulmonary nodule: Secondary | ICD-10-CM

## 2018-08-25 NOTE — Telephone Encounter (Signed)
Notes recorded by Shon Hale, CMA on 08/24/2018 at 9:46 AM EDT lmtcb x1 for pt ------  Notes recorded by Marshell Garfinkel, MD on 08/24/2018 at 9:20 AM EDT Please let patient know CT shows stable lung nodules We will continue to monitor this Order CT chest without contrast in 6 months.  Called and spoke with pt letting her know the results of the CT. Stated to pt I was going to go ahead and place order for scan to be repeated in 6 months. Pt expressed understanding. I also made sure that pt knew where our new office is located since she does have f/u appt with Dr. Vaughan Browner 3/30 and pt stated she did have the office address. Nothing further needed.

## 2018-09-06 ENCOUNTER — Ambulatory Visit: Payer: Medicare Other | Admitting: Pulmonary Disease

## 2019-03-01 ENCOUNTER — Other Ambulatory Visit: Payer: Self-pay

## 2019-03-01 ENCOUNTER — Ambulatory Visit (INDEPENDENT_AMBULATORY_CARE_PROVIDER_SITE_OTHER)
Admission: RE | Admit: 2019-03-01 | Discharge: 2019-03-01 | Disposition: A | Discharge status: Home / Self Care | Payer: Medicare Other | Source: Ambulatory Visit | Attending: Pulmonary Disease | Admitting: Pulmonary Disease

## 2019-03-01 DIAGNOSIS — R911 Solitary pulmonary nodule: Secondary | ICD-10-CM

## 2019-03-02 ENCOUNTER — Encounter: Payer: Self-pay | Admitting: Pulmonary Disease

## 2019-03-02 ENCOUNTER — Ambulatory Visit: Payer: Medicare Other | Admitting: Pulmonary Disease

## 2019-03-02 VITALS — BP 120/80 | HR 67 | Temp 97.3°F | Ht 64.5 in | Wt 186.0 lb

## 2019-03-02 DIAGNOSIS — R918 Other nonspecific abnormal finding of lung field: Secondary | ICD-10-CM

## 2019-03-02 DIAGNOSIS — Z23 Encounter for immunization: Secondary | ICD-10-CM | POA: Diagnosis not present

## 2019-03-02 DIAGNOSIS — R911 Solitary pulmonary nodule: Secondary | ICD-10-CM | POA: Diagnosis not present

## 2019-03-02 NOTE — Patient Instructions (Signed)
Will order CT chest without contrast in 1 year time for follow-up of lung nodule We will give a flu vaccine today Follow-up in 1 year after CT scan.

## 2019-03-02 NOTE — Progress Notes (Signed)
Sara Bryan    XU:[id number]    03/16/1941  Primary Care Physician:Swayne, Shanon Brow, MD  Referring Physician: Antony Contras, MD [place],  [place] [address]  Chief complaint: Follow up for abnormal CT scan  HPI: Sara Bryan is a 78 year old with past medical history of melanoma status post resection. She has been followed by Dr. Marin Olp with no evidence of recurrence. She is being followed up in pulmonary clinic for lung nodules  Interim history:  Here for review of CT.  Feels well with no complaints.  Outpatient Encounter Medications as of 03/02/2019  Medication Sig  . acetaminophen (TYLENOL) 500 MG tablet Take 500 mg by mouth every 6 (six) hours as needed for mild pain.  Marland Kitchen aspirin EC 81 MG tablet Take 81 mg by mouth daily.  Marland Kitchen b complex vitamins tablet Take 1 tablet by mouth daily.    Marland Kitchen CALCIUM PO Take 1,000 mg by mouth daily.   . cetirizine (ZYRTEC) 10 MG tablet Take 10 mg by mouth daily as needed for allergies.  . CHOLECALCIFEROL PO Take 6,000 Units by mouth daily.  . Coenzyme Q10 (CO Q-10 PO) Take 100 mg by mouth daily.   . fish oil-omega-3 fatty acids 1000 MG capsule Take 1 g by mouth daily.    . fluorometholone (FML) 0.1 % ophthalmic suspension INSTILL 1 DROP TWICE DAILY INTO RIGHT EYE  . HYDROcodone-acetaminophen (NORCO/VICODIN) 5-325 MG tablet Take 1-2 tablets by mouth every 4 (four) hours as needed for moderate pain or severe pain.  Marland Kitchen ibuprofen (ADVIL,MOTRIN) 200 MG tablet Take 200 mg by mouth every 6 (six) hours as needed for moderate pain.  Marland Kitchen levothyroxine (SYNTHROID) 50 MCG tablet Take 50 mcg by mouth daily.  . Multiple Vitamins-Minerals (MULTIVITAMINS THER. W/MINERALS) TABS Take 1 tablet by mouth daily.    . Probiotic Product (PROBIOTIC DAILY PO) Take 1 tablet by mouth daily.   . rosuvastatin (CRESTOR) 10 MG tablet Take 10 mg by mouth daily.   . valACYclovir (VALTREX) 1000 MG tablet Take 1,000 mg by mouth daily.  . vitamin C  (ASCORBIC ACID) 500 MG tablet Take 500 mg by mouth daily.     No facility-administered encounter medications on file as of 03/02/2019.    Physical Exam: Blood pressure 128/76, pulse 82, weight 194 lb 12.8 oz (88.4 kg), SpO2 98 %. Gen:      No acute distress HEENT:  EOMI, sclera anicteric Neck:     No masses; no thyromegaly Lungs:    Clear to auscultation bilaterally; normal respiratory effort CV:         Regular rate and rhythm; no murmurs Abd:      + bowel sounds; soft, non-tender; no palpable masses, no distension Ext:    No edema; adequate peripheral perfusion Skin:      Warm and dry; no rash Neuro: alert and oriented x 3 Psych: normal mood and affect  Data Reviewed: CT scan 02/20/16- RUL nodule 9 mmm. RLL stable calcified nodule CT scan 05/19/06- Right upper lobe 3 mm nodule, right lower lobe nodule. PET scan 02/20/16- no PET uptake. CT 02/09/2018- majority of the pulmonary nodules are stable.  New 3 mm nodule in the right major fissure.  Area of scarring in the left lower lobe looks more prominent. CT 08/23/2018- 3 mm left base lung nodule. CT scan 03/01/2019-previously noted 3 mm nodule in the right lower lobe is stable.  A dose of centimeter pulmonary nodules are  stable as well All images reviewed  Assessment:  Eval for lung nodule. CT scan images from 2007 and 2020 reviewed.  Most of the nodules are stable.  Favor benign etiology However given her history of melanoma [facility] will order a repeat CT in 1 year  Health maintenance 10/14/2013-Prevnar 11/20/2004-Pneumovax Give flu vaccine  Plan/Recommendations: - Repeat CT in 1 year - Flu vaccine  Marshell Garfinkel MD [facility] 03/02/2019, 9:12 AM CC: Antony Contras, MD

## 2019-03-02 NOTE — Addendum Note (Signed)
Addended by: Hildred Alamin I on: 03/02/2019 09:23 AM   Modules accepted: Orders

## 2019-07-08 ENCOUNTER — Ambulatory Visit: Payer: Medicare Other

## 2019-07-16 ENCOUNTER — Ambulatory Visit: Payer: Medicare Other

## 2019-08-01 ENCOUNTER — Inpatient Hospital Stay: Payer: Medicare Other | Attending: Hematology & Oncology | Admitting: Hematology & Oncology

## 2019-08-01 ENCOUNTER — Encounter: Payer: Self-pay | Admitting: Hematology & Oncology

## 2019-08-01 ENCOUNTER — Other Ambulatory Visit: Payer: Self-pay

## 2019-08-01 ENCOUNTER — Inpatient Hospital Stay: Payer: Medicare Other

## 2019-08-01 VITALS — BP 140/60 | HR 75 | Temp 97.5°F | Wt 207.0 lb

## 2019-08-01 DIAGNOSIS — Z85820 Personal history of malignant melanoma of skin: Secondary | ICD-10-CM | POA: Insufficient documentation

## 2019-08-01 DIAGNOSIS — C4361 Malignant melanoma of right upper limb, including shoulder: Secondary | ICD-10-CM

## 2019-08-01 LAB — CMP (CANCER CENTER ONLY)
ALT: 16 U/L (ref 0–44)
AST: 19 U/L (ref 15–41)
Albumin: 4.1 g/dL (ref 3.5–5.0)
Alkaline Phosphatase: 90 U/L (ref 38–126)
Anion gap: 7 (ref 5–15)
BUN: 21 mg/dL (ref 8–23)
CO2: 30 mmol/L (ref 22–32)
Calcium: 9.8 mg/dL (ref 8.9–10.3)
Chloride: 104 mmol/L (ref 98–111)
Creatinine: 0.67 mg/dL (ref 0.44–1.00)
GFR, Est AFR Am: >60 mL/min (ref >60)
GFR, Estimated: >60 mL/min (ref >60)
Glucose, Bld: 108 mg/dL — ABNORMAL HIGH (ref 70–99)
Potassium: 4.2 mmol/L (ref 3.5–5.1)
Sodium: 141 mmol/L (ref 135–145)
Total Bilirubin: 1.1 mg/dL (ref 0.3–1.2)
Total Protein: 7.8 g/dL (ref 6.5–8.1)

## 2019-08-01 LAB — CBC WITH DIFFERENTIAL (CANCER CENTER ONLY)
Abs Immature Granulocytes: 0.02 10*3/uL (ref 0.00–0.07)
Basophils Absolute: 0 10*3/uL (ref 0.0–0.1)
Basophils Relative: 1 %
Eosinophils Absolute: 0.3 10*3/uL (ref 0.0–0.5)
Eosinophils Relative: 4 %
HCT: 46.8 % — ABNORMAL HIGH (ref 36.0–46.0)
Hemoglobin: 15.6 g/dL — ABNORMAL HIGH (ref 12.0–15.0)
Immature Granulocytes: 0 %
Lymphocytes Relative: 22 %
Lymphs Abs: 1.8 10*3/uL (ref 0.7–4.0)
MCH: 31 pg (ref 26.0–34.0)
MCHC: 33.3 g/dL (ref 30.0–36.0)
MCV: 92.9 fL (ref 80.0–100.0)
Monocytes Absolute: 0.9 10*3/uL (ref 0.1–1.0)
Monocytes Relative: 12 %
Neutro Abs: 5 10*3/uL (ref 1.7–7.7)
Neutrophils Relative %: 61 %
Platelet Count: 300 10*3/uL (ref 150–400)
RBC: 5.04 MIL/uL (ref 3.87–5.11)
RDW: 12.6 % (ref 11.5–15.5)
WBC Count: 8 10*3/uL (ref 4.0–10.5)
nRBC: 0 % (ref 0.0–0.2)

## 2019-08-01 LAB — LACTATE DEHYDROGENASE: LDH: 219 U/L — ABNORMAL HIGH (ref 98–192)

## 2019-08-01 NOTE — Progress Notes (Signed)
Hematology and Oncology Follow Up Visit  Sara Bryan:[id number] 1941-02-11 79 y.o. 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  She typically comes back yearly to see [place].  She is now 79 years old.  She had a quiet 79th birthday.  She is still working.  She still enjoys working.  Her back is bothering her a little bit.  This might be from the weight gain that she has put on since we last saw her a year ago.  I told her that she can walk a little bit more and this might help.  She has had some urinary tract infections.  I told her that she must stay hydrated.  I recommended drinking 64 ounces of water daily.  She has had no problems with headache.  She has had no fever.  She has had no nausea or vomiting.  There is been no rashes.  She has had no swollen lymph nodes.   Overall, her performance status is ECOG 1.     Medications:  Allergies as of 08/01/2019      Reactions   Levaquin [levofloxacin] Other (See Comments)   Diarrhea/Muscle weakness/inability to lift arms/limbs   Aspirin Nausea Only, Nausea And Vomiting   Enteric is ok   Atorvastatin    Other reaction(s): myalgias   Codeine Nausea Only      Medication List       Accurate as of August 01, 2019  8:49 AM. If you have any questions, ask your nurse or doctor.        acetaminophen 500 MG tablet Commonly known as: TYLENOL Take 500 mg by mouth every 6 (six) hours as needed for mild pain.   aspirin EC 81 MG tablet Take 81 mg by mouth daily.   b complex vitamins tablet Take 1 tablet by mouth daily.   CALCIUM PO Take 1,000 mg by mouth daily.   cetirizine 10 MG tablet Commonly known as: ZYRTEC Take 10 mg by mouth daily as needed for allergies.   CHOLECALCIFEROL PO Take 6,000 Units by mouth daily.   CO Q-10 PO Take 100 mg by mouth daily.   fish oil-omega-3  fatty acids 1000 MG capsule Take 1 g by mouth daily.   fluorometholone 0.1 % ophthalmic suspension Commonly known as: FML INSTILL 1 DROP TWICE DAILY INTO RIGHT EYE   HYDROcodone-acetaminophen 5-325 MG tablet Commonly known as: NORCO/VICODIN Take 1-2 tablets by mouth every 4 (four) hours as needed for moderate pain or severe pain.   ibuprofen 200 MG tablet Commonly known as: ADVIL Take 200 mg by mouth every 6 (six) hours as needed for moderate pain.   multivitamins ther. w/minerals Tabs tablet Take 1 tablet by mouth daily.   PROBIOTIC DAILY PO Take 1 tablet by mouth daily.   rosuvastatin 10 MG tablet Commonly known as: CRESTOR Take 10 mg by mouth daily.   Synthroid 50 MCG tablet Generic drug: levothyroxine Take 50 mcg by mouth daily.   valACYclovir 1000 MG tablet Commonly known as: VALTREX Take 1,000 mg by mouth daily.   vitamin C 500 MG tablet Commonly known as: ASCORBIC ACID Take 500 mg by mouth daily.       Allergies:  Allergies  Allergen Reactions  . Levaquin [Levofloxacin] Other (See Comments)    Diarrhea/Muscle weakness/inability to lift arms/limbs  . Aspirin Nausea Only  and Nausea And Vomiting    Enteric is ok  . Atorvastatin     Other reaction(s): myalgias  . Codeine Nausea Only    Past Medical History, Surgical history, Social history, and Family History were reviewed and updated.  Review of Systems: Review of Systems  Constitutional: Negative.   HENT: Negative.   Eyes: Negative.   Respiratory: Negative.   Cardiovascular: Negative.   Gastrointestinal: Positive for heartburn.  Genitourinary: Negative.   Musculoskeletal: Negative.   Skin: Negative.   Neurological: Negative.   Endo/Heme/Allergies: Negative.   Psychiatric/Behavioral: Negative.     Physical Exam:  weight is 207 lb (93.9 kg). Her oral temperature is 97.5 F (36.4 C) (abnormal). Her blood pressure is 140/60 and her pulse is 75. Her oxygen saturation is 100%.   Wt Readings  from Last 3 Encounters:  08/01/19 207 lb (93.9 kg)  03/02/19 186 lb (84.4 kg)  08/02/18 193 lb 12 oz (87.9 kg)    Physical Exam Vitals reviewed.  HENT:     Head: Normocephalic and atraumatic.  Eyes:     Pupils: Pupils are equal, round, and reactive to light.  Cardiovascular:     Rate and Rhythm: Normal rate and regular rhythm.     Heart sounds: Normal heart sounds.  Pulmonary:     Effort: Pulmonary effort is normal.     Breath sounds: Normal breath sounds.  Abdominal:     General: Bowel sounds are normal.     Palpations: Abdomen is soft.  Musculoskeletal:        General: No tenderness or deformity. Normal range of motion.     Cervical back: Normal range of motion.  Lymphadenopathy:     Cervical: No cervical adenopathy.  Skin:    General: Skin is warm and dry.     Findings: No erythema or rash.  Neurological:     Mental Status: She is alert and oriented to person, place, and time.  Psychiatric:        Behavior: Behavior normal.        Thought Content: Thought content normal.        Judgment: Judgment normal.      Lab Results  Component Value Date   WBC 8.0 08/01/2019   HGB 15.6 (H) 08/01/2019   HCT 46.8 (H) 08/01/2019   MCV 92.9 08/01/2019   PLT 300 08/01/2019   No results found for: FERRITIN, IRON, TIBC, UIBC, IRONPCTSAT Lab Results  Component Value Date   RBC 5.04 08/01/2019   No results found for: KPAFRELGTCHN, LAMBDASER, KAPLAMBRATIO No results found for: Kandis Cocking, IGMSERUM No results found for: Odetta Pink, SPEI   Chemistry      Component Value Date/Time   NA 141 08/01/2019 0807   NA 133 (L) 08/04/2016 1459   NA 142 02/26/2016 0921   K 4.2 08/01/2019 0807   K 4.0 08/04/2016 1459   K 4.1 02/26/2016 0921   CL 104 08/01/2019 0807   CL 103 08/04/2016 1459   CL 105 02/26/2015 0807   CO2 30 08/01/2019 0807   CO2 26 08/04/2016 1459   CO2 25 02/26/2016 0921   BUN 21 08/01/2019 0807   BUN 18  08/04/2016 1459   BUN 18.0 02/26/2016 0921   CREATININE 0.67 08/01/2019 0807   CREATININE 0.64 08/04/2016 1459   CREATININE 0.8 02/26/2016 0921      Component Value Date/Time   CALCIUM 9.8 08/01/2019 0807   CALCIUM 9.4 08/04/2016 1459   CALCIUM  9.7 02/26/2016 0921   ALKPHOS 90 08/01/2019 0807   ALKPHOS 80 08/04/2016 1459   ALKPHOS 75 02/26/2016 0921   AST 19 08/01/2019 0807   AST 23 02/26/2016 0921   ALT 16 08/01/2019 0807   ALT 25 02/26/2016 0921   BILITOT 1.1 08/01/2019 0807   BILITOT 1.62 (H) 02/26/2016 0921     Impression and Plan: Ms. Mcelvany is 79 yo white female with history of a stage IB melanoma of the right arm resection back in November 2012. So far, she has done well and there has been no evidence of recurrence.   For right now, I still do not see a problem with respect to the melanoma.  It has  now been little over 8 years.  I really believe that her risk of recurrence is going be less than 5%.     She still likes to come back to see [place].  We will get her back in 1 year.  [name], MD 2/22/20218:49 AM

## 2020-02-01 ENCOUNTER — Other Ambulatory Visit: Payer: Self-pay | Admitting: Oncology

## 2020-02-01 ENCOUNTER — Encounter: Payer: Self-pay | Admitting: Oncology

## 2020-02-01 DIAGNOSIS — U071 COVID-19: Secondary | ICD-10-CM

## 2020-02-01 NOTE — Progress Notes (Signed)
I connected by phone with   to discuss the potential use of an new treatment for mild to moderate COVID-19 viral infection in non-hospitalized patients.   This patient is a age/sex that meets the FDA criteria for Emergency Use Authorization of casirivimab\imdevimab.  Has a (+) direct SARS-CoV-2 viral test result 1. Has mild or moderate COVID-19  2. Is ? 80 years of age and weighs ? 40 kg 3. Is NOT hospitalized due to COVID-19 4. Is NOT requiring oxygen therapy or requiring an increase in baseline oxygen flow rate due to COVID-19 5. Is within 10 days of symptom onset 6. Has at least one of the high risk factor(s) for progression to severe COVID-19 and/or hospitalization as defined in EUA. ? Specific high risk criteria : age, htn    Symptom onset  01/28/20   I have spoken and communicated the following to the patient or parent/caregiver:   1. FDA has authorized the emergency use of casirivimab\imdevimab for the treatment of mild to moderate COVID-19 in adults and pediatric patients with positive results of direct SARS-CoV-2 viral testing who are 19 years of age and older weighing at least 40 kg, and who are at high risk for progressing to severe COVID-19 and/or hospitalization.   2. The significant known and potential risks and benefits of casirivimab\imdevimab, and the extent to which such potential risks and benefits are unknown.   3. Information on available alternative treatments and the risks and benefits of those alternatives, including clinical trials.   4. Patients treated with casirivimab\imdevimab should continue to self-isolate and use infection control measures (e.g., wear mask, isolate, social distance, avoid sharing personal items, clean and disinfect "high touch" surfaces, and frequent handwashing) according to CDC guidelines.    5. The patient or parent/caregiver has the option to accept or refuse casirivimab\imdevimab .   After reviewing this information with the patient,  The patient agreed to proceed with receiving casirivimab\imdevimab infusion and will be provided a copy of the Fact sheet prior to receiving the infusion.Rulon Abide, AGNP-C [phone] ([place])

## 2020-02-02 ENCOUNTER — Ambulatory Visit (HOSPITAL_COMMUNITY)
Admission: RE | Admit: 2020-02-02 | Discharge: 2020-02-02 | Disposition: A | Discharge status: Home / Self Care | Payer: Medicare Other | Source: Ambulatory Visit | Attending: Pulmonary Disease | Admitting: Pulmonary Disease

## 2020-02-02 DIAGNOSIS — U071 COVID-19: Secondary | ICD-10-CM | POA: Insufficient documentation

## 2020-02-02 DIAGNOSIS — Z23 Encounter for immunization: Secondary | ICD-10-CM | POA: Insufficient documentation

## 2020-02-02 MED ORDER — FAMOTIDINE IN NACL 20-0.9 MG/50ML-% IV SOLN: 20.0000 mg | Freq: Once | INTRAVENOUS | Status: DC | PRN

## 2020-02-02 MED ORDER — DIPHENHYDRAMINE HCL 50 MG/ML IJ SOLN: 50.0000 mg | Freq: Once | INTRAMUSCULAR | Status: DC | PRN

## 2020-02-02 MED ORDER — EPINEPHRINE 0.3 MG/0.3ML IJ SOAJ: 0.3000 mg | Freq: Once | INTRAMUSCULAR | Status: DC | PRN

## 2020-02-02 MED ORDER — METHYLPREDNISOLONE SODIUM SUCC 125 MG IJ SOLR: 125.0000 mg | Freq: Once | INTRAMUSCULAR | Status: DC | PRN

## 2020-02-02 MED ORDER — SODIUM CHLORIDE 0.9 % IV SOLN
1200.0000 mg | Freq: Once | INTRAVENOUS | Status: AC
  Administered 2020-02-02: 1200 mg via INTRAVENOUS
  Filled 2020-02-02: qty 10

## 2020-02-02 MED ORDER — ALBUTEROL SULFATE HFA 108 (90 BASE) MCG/ACT IN AERS: 2.0000 | INHALATION_SPRAY | Freq: Once | RESPIRATORY_TRACT | Status: DC | PRN

## 2020-02-02 MED ORDER — SODIUM CHLORIDE 0.9 % IV SOLN: INTRAVENOUS | Status: DC | PRN

## 2020-02-02 NOTE — Discharge Instructions (Signed)

## 2020-02-02 NOTE — Progress Notes (Signed)
  Diagnosis: COVID-19  Physician: Dr. Asencion Noble  Procedure: Covid Infusion Clinic Med: casirivimab\imdevimab infusion - Provided patient with casirivimab\imdevimab fact sheet for patients, parents and caregivers prior to infusion.  Complications: No immediate complications noted.  Discharge: Discharged home   Gaye Alken 02/02/2020

## 2020-03-01 ENCOUNTER — Ambulatory Visit (INDEPENDENT_AMBULATORY_CARE_PROVIDER_SITE_OTHER)
Admission: RE | Admit: 2020-03-01 | Discharge: 2020-03-01 | Disposition: A | Discharge status: Home / Self Care | Payer: Medicare Other | Source: Ambulatory Visit | Attending: Pulmonary Disease | Admitting: Pulmonary Disease

## 2020-03-01 ENCOUNTER — Other Ambulatory Visit: Payer: Self-pay

## 2020-03-01 DIAGNOSIS — R918 Other nonspecific abnormal finding of lung field: Secondary | ICD-10-CM | POA: Diagnosis not present

## 2020-03-06 ENCOUNTER — Other Ambulatory Visit: Payer: Self-pay | Admitting: Pulmonary Disease

## 2020-03-06 DIAGNOSIS — R918 Other nonspecific abnormal finding of lung field: Secondary | ICD-10-CM

## 2020-03-06 NOTE — Progress Notes (Signed)
Patient contacted with results of recent chest CT. Dr. Matilde Bash result notes reviewed. Order for repeat CT placed for around 03/01/2021. Patient verbalized understanding of results and ongoing plan of care.

## 2020-05-08 ENCOUNTER — Other Ambulatory Visit (HOSPITAL_BASED_OUTPATIENT_CLINIC_OR_DEPARTMENT_OTHER): Payer: Self-pay | Admitting: Internal Medicine

## 2020-05-08 ENCOUNTER — Ambulatory Visit: Payer: Medicare Other | Attending: Internal Medicine

## 2020-05-08 DIAGNOSIS — Z23 Encounter for immunization: Secondary | ICD-10-CM

## 2020-05-08 MED FILL — PFIZER-BIONTECH COVID-19 VA: 30 | 1 days supply | Qty: 0 | Fill #0

## 2020-05-08 NOTE — Progress Notes (Signed)
   Covid-19 Vaccination Clinic  Name:  [name]    MRN: [id number] DOB: March 29, 1941  05/08/2020  Ms. Wisdom was observed post Covid-19 immunization for 15 minutes without incident. She was provided with Vaccine Information Sheet and instruction to access the V-Safe system.   [name] was instructed to call 911 with any severe reactions post vaccine: Marland Kitchen Difficulty breathing  . Swelling of face and throat  . A fast heartbeat  . A bad rash all over body  . Dizziness and weakness   Immunizations Administered    Name Date Dose VIS Date Route   Pfizer COVID-19 Vaccine 05/08/2020  9:20 AM 0.3 mL 03/28/2020 Intramuscular   Manufacturer: [place]   Lot: [id number]   [place]: [id number]

## 2020-06-11 DIAGNOSIS — Z85828 Personal history of other malignant neoplasm of skin: Secondary | ICD-10-CM | POA: Diagnosis not present

## 2020-06-11 DIAGNOSIS — L821 Other seborrheic keratosis: Secondary | ICD-10-CM | POA: Diagnosis not present

## 2020-06-11 DIAGNOSIS — L570 Actinic keratosis: Secondary | ICD-10-CM | POA: Diagnosis not present

## 2020-06-11 DIAGNOSIS — Z85820 Personal history of malignant melanoma of skin: Secondary | ICD-10-CM | POA: Diagnosis not present

## 2020-06-11 DIAGNOSIS — L819 Disorder of pigmentation, unspecified: Secondary | ICD-10-CM | POA: Diagnosis not present

## 2020-06-11 DIAGNOSIS — D229 Melanocytic nevi, unspecified: Secondary | ICD-10-CM | POA: Diagnosis not present

## 2020-06-11 DIAGNOSIS — L905 Scar conditions and fibrosis of skin: Secondary | ICD-10-CM | POA: Diagnosis not present

## 2020-06-11 DIAGNOSIS — L814 Other melanin hyperpigmentation: Secondary | ICD-10-CM | POA: Diagnosis not present

## 2020-07-10 DIAGNOSIS — H52203 Unspecified astigmatism, bilateral: Secondary | ICD-10-CM | POA: Diagnosis not present

## 2020-07-10 DIAGNOSIS — Z961 Presence of intraocular lens: Secondary | ICD-10-CM | POA: Diagnosis not present

## 2020-07-10 DIAGNOSIS — D3131 Benign neoplasm of right choroid: Secondary | ICD-10-CM | POA: Diagnosis not present

## 2020-07-10 DIAGNOSIS — D3132 Benign neoplasm of left choroid: Secondary | ICD-10-CM | POA: Diagnosis not present

## 2020-07-19 DIAGNOSIS — D3132 Benign neoplasm of left choroid: Secondary | ICD-10-CM | POA: Diagnosis not present

## 2020-07-19 DIAGNOSIS — H53001 Unspecified amblyopia, right eye: Secondary | ICD-10-CM | POA: Diagnosis not present

## 2020-07-19 DIAGNOSIS — Z961 Presence of intraocular lens: Secondary | ICD-10-CM | POA: Diagnosis not present

## 2020-08-03 ENCOUNTER — Other Ambulatory Visit: Payer: Self-pay | Admitting: *Deleted

## 2020-08-03 DIAGNOSIS — U071 COVID-19: Secondary | ICD-10-CM

## 2020-08-03 DIAGNOSIS — C4361 Malignant melanoma of right upper limb, including shoulder: Secondary | ICD-10-CM

## 2020-08-06 ENCOUNTER — Telehealth: Payer: Self-pay | Admitting: Hematology & Oncology

## 2020-08-06 ENCOUNTER — Inpatient Hospital Stay: Payer: Medicare Other | Attending: Hematology & Oncology | Admitting: Hematology & Oncology

## 2020-08-06 ENCOUNTER — Other Ambulatory Visit: Payer: Self-pay

## 2020-08-06 ENCOUNTER — Encounter: Payer: Self-pay | Admitting: Hematology & Oncology

## 2020-08-06 ENCOUNTER — Inpatient Hospital Stay: Payer: Medicare Other

## 2020-08-06 VITALS — BP 120/61 | HR 61 | Temp 97.8°F | Resp 20 | Wt 193.0 lb

## 2020-08-06 DIAGNOSIS — C4361 Malignant melanoma of right upper limb, including shoulder: Secondary | ICD-10-CM

## 2020-08-06 DIAGNOSIS — Z85820 Personal history of malignant melanoma of skin: Secondary | ICD-10-CM | POA: Diagnosis not present

## 2020-08-06 DIAGNOSIS — U071 COVID-19: Secondary | ICD-10-CM

## 2020-08-06 LAB — CBC WITH DIFFERENTIAL (CANCER CENTER ONLY)
Abs Immature Granulocytes: 0.03 10*3/uL (ref 0.00–0.07)
Basophils Absolute: 0.1 10*3/uL (ref 0.0–0.1)
Basophils Relative: 1 %
Eosinophils Absolute: 0.2 10*3/uL (ref 0.0–0.5)
Eosinophils Relative: 2 %
HCT: 45.2 % (ref 36.0–46.0)
Hemoglobin: 15.1 g/dL — ABNORMAL HIGH (ref 12.0–15.0)
Immature Granulocytes: 0 %
Lymphocytes Relative: 28 %
Lymphs Abs: 2.3 10*3/uL (ref 0.7–4.0)
MCH: 30.6 pg (ref 26.0–34.0)
MCHC: 33.4 g/dL (ref 30.0–36.0)
MCV: 91.5 fL (ref 80.0–100.0)
Monocytes Absolute: 0.8 10*3/uL (ref 0.1–1.0)
Monocytes Relative: 10 %
Neutro Abs: 4.8 10*3/uL (ref 1.7–7.7)
Neutrophils Relative %: 59 %
Platelet Count: 287 10*3/uL (ref 150–400)
RBC: 4.94 MIL/uL (ref 3.87–5.11)
RDW: 12.3 % (ref 11.5–15.5)
WBC Count: 8.3 10*3/uL (ref 4.0–10.5)
nRBC: 0 % (ref 0.0–0.2)

## 2020-08-06 LAB — CMP (CANCER CENTER ONLY)
ALT: 16 U/L (ref 0–44)
AST: 18 U/L (ref 15–41)
Albumin: 3.9 g/dL (ref 3.5–5.0)
Alkaline Phosphatase: 73 U/L (ref 38–126)
Anion gap: 6 (ref 5–15)
BUN: 22 mg/dL (ref 8–23)
CO2: 30 mmol/L (ref 22–32)
Calcium: 9.6 mg/dL (ref 8.9–10.3)
Chloride: 102 mmol/L (ref 98–111)
Creatinine: 0.72 mg/dL (ref 0.44–1.00)
GFR, Estimated: >60 mL/min (ref >60)
Glucose, Bld: 104 mg/dL — ABNORMAL HIGH (ref 70–99)
Potassium: 4.1 mmol/L (ref 3.5–5.1)
Sodium: 138 mmol/L (ref 135–145)
Total Bilirubin: 1.4 mg/dL — ABNORMAL HIGH (ref 0.3–1.2)
Total Protein: 7.2 g/dL (ref 6.5–8.1)

## 2020-08-06 LAB — LACTATE DEHYDROGENASE: LDH: 169 U/L (ref 98–192)

## 2020-08-06 NOTE — Telephone Encounter (Signed)
Appointments scheduled patient will get updates from My Chart per 2/28 los

## 2020-08-06 NOTE — Progress Notes (Signed)
Hematology and Oncology Follow Up Visit  Sara Bryan [id number] [birth year] 80 y.o. 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  She is doing quite well.  She is still working.  She enjoys work.  She does not travel much.  She likes to stay home.  Her health has been quite nice.  She has had no problems with respect to health issues.  She had a mammogram done last week.  She has had no problems with fever.  She has had no issues with the coronavirus.  There is been no leg swelling.  She has had no problems with lymphedema in the right arm.  She has had no change in bowel or bladder habits.  She has had no bleeding.  She has had no swollen lymph nodes.  Overall, performance status is ECOG 1.     Medications:  Allergies as of 08/06/2020      Reactions   Levaquin [levofloxacin] Other (See Comments)   Diarrhea/Muscle weakness/inability to lift arms/limbs   Aspirin Nausea Only, Nausea And Vomiting   Enteric is ok   Atorvastatin    Other reaction(s): myalgias   Codeine Nausea Only      Medication List       Accurate as of August 06, 2020  8:57 AM. If you have any questions, ask your nurse or doctor.        STOP taking these medications   HYDROcodone-acetaminophen 5-325 MG tablet Commonly known as: NORCO/VICODIN Stopped by: Sara Napoleon, MD   rosuvastatin 10 MG tablet Commonly known as: CRESTOR Stopped by: Sara Napoleon, MD   valACYclovir 1000 MG tablet Commonly known as: VALTREX Stopped by: Sara Napoleon, MD     TAKE these medications   acetaminophen 500 MG tablet Commonly known as: TYLENOL Take 500 mg by mouth every 6 (six) hours as needed for mild pain.   aspirin EC 81 MG tablet Take 81 mg by mouth daily.   b complex vitamins tablet Take 1 tablet by mouth daily.   CALCIUM PO Take 1,000 mg by mouth  daily.   cetirizine 10 MG tablet Commonly known as: ZYRTEC Take 10 mg by mouth daily as needed for allergies.   CHOLECALCIFEROL PO Take 6,000 Units by mouth daily.   CO Q-10 PO Take 100 mg by mouth daily.   fish oil-omega-3 fatty acids 1000 MG capsule Take 1 g by mouth daily.   fluorometholone 0.1 % ophthalmic suspension Commonly known as: FML INSTILL 1 DROP TWICE DAILY INTO RIGHT EYE   ibuprofen 200 MG tablet Commonly known as: ADVIL Take 200 mg by mouth every 6 (six) hours as needed for moderate pain.   levothyroxine 50 MCG tablet Commonly known as: SYNTHROID Take 50 mcg by mouth daily.   multivitamins ther. w/minerals Tabs tablet Take 1 tablet by mouth daily.   PROBIOTIC DAILY PO Take 1 tablet by mouth daily.   vitamin C 500 MG tablet Commonly known as: ASCORBIC ACID Take 500 mg by mouth daily.       Allergies:  Allergies  Allergen Reactions  . Levaquin [Levofloxacin] Other (See Comments)    Diarrhea/Muscle weakness/inability to lift arms/limbs  . Aspirin Nausea Only and Nausea And Vomiting    Enteric is ok  . Atorvastatin     Other reaction(s): myalgias  . Codeine Nausea Only  Past Medical History, Surgical history, Social history, and Family History were reviewed and updated.  Review of Systems: Review of Systems  Constitutional: Negative.   HENT: Negative.   Eyes: Negative.   Respiratory: Negative.   Cardiovascular: Negative.   Gastrointestinal: Positive for heartburn.  Genitourinary: Negative.   Musculoskeletal: Negative.   Skin: Negative.   Neurological: Negative.   Endo/Heme/Allergies: Negative.   Psychiatric/Behavioral: Negative.     Physical Exam:  weight is 193 lb (87.5 kg). Her oral temperature is 97.8 F (36.6 C). Her blood pressure is 120/61 and her pulse is 61. Her respiration is 20 and oxygen saturation is 99%.   Wt Readings from Last 3 Encounters:  08/06/20 193 lb (87.5 kg)  08/01/19 207 lb (93.9 kg)  03/02/19 186 lb  (84.4 kg)    Physical Exam Vitals reviewed.  HENT:     Head: Normocephalic and atraumatic.  Eyes:     Pupils: Pupils are equal, round, and reactive to light.  Cardiovascular:     Rate and Rhythm: Normal rate and regular rhythm.     Heart sounds: Normal heart sounds.  Pulmonary:     Effort: Pulmonary effort is normal.     Breath sounds: Normal breath sounds.  Abdominal:     General: Bowel sounds are normal.     Palpations: Abdomen is soft.  Musculoskeletal:        General: No tenderness or deformity. Normal range of motion.     Cervical back: Normal range of motion.  Lymphadenopathy:     Cervical: No cervical adenopathy.  Skin:    General: Skin is warm and dry.     Findings: No erythema or rash.  Neurological:     Mental Status: She is alert and oriented to person, place, and time.  Psychiatric:        Behavior: Behavior normal.        Thought Content: Thought content normal.        Judgment: Judgment normal.      Lab Results  Component Value Date   WBC 8.3 08/06/2020   HGB 15.1 (H) 08/06/2020   HCT 45.2 08/06/2020   MCV 91.5 08/06/2020   PLT 287 08/06/2020   No results found for: FERRITIN, IRON, TIBC, UIBC, IRONPCTSAT Lab Results  Component Value Date   RBC 4.94 08/06/2020   No results found for: KPAFRELGTCHN, LAMBDASER, KAPLAMBRATIO No results found for: Kandis Cocking, IGMSERUM No results found for: Odetta Pink, SPEI   Chemistry      Component Value Date/Time   NA 138 08/06/2020 0758   NA 133 (L) 08/04/2016 1459   NA 142 02/26/2016 0921   K 4.1 08/06/2020 0758   K 4.0 08/04/2016 1459   K 4.1 02/26/2016 0921   CL 102 08/06/2020 0758   CL 103 08/04/2016 1459   CL 105 02/26/2015 0807   CO2 30 08/06/2020 0758   CO2 26 08/04/2016 1459   CO2 25 02/26/2016 0921   BUN 22 08/06/2020 0758   BUN 18 08/04/2016 1459   BUN 18.0 02/26/2016 0921   CREATININE 0.72 08/06/2020 0758   CREATININE 0.64 08/04/2016  1459   CREATININE 0.8 02/26/2016 0921      Component Value Date/Time   CALCIUM 9.6 08/06/2020 0758   CALCIUM 9.4 08/04/2016 1459   CALCIUM 9.7 02/26/2016 0921   ALKPHOS 73 08/06/2020 0758   ALKPHOS 80 08/04/2016 1459   ALKPHOS 75 02/26/2016 0921   AST 18 08/06/2020 0758  AST 23 02/26/2016 0921   ALT 16 08/06/2020 0758   ALT 25 02/26/2016 0921   BILITOT 1.4 (H) 08/06/2020 0758   BILITOT 1.62 (H) 02/26/2016 0921     Impression and Plan: Ms. Hanford is 80 yo white female with history of a stage IB melanoma of the right arm resection back in November 2012. So far, she has done well and there has been no evidence of recurrence.   For right now, I still do not see a problem with respect to the melanoma.  It has  now been little over 9 years.  I really believe that her risk of recurrence is going be less than 5%.     She still likes to come back to see [place].  We will get her back in 1 year.  Sara Napoleon, MD 2/28/20228:57 AM

## 2020-10-29 ENCOUNTER — Ambulatory Visit: Payer: Medicare Other | Attending: Internal Medicine

## 2020-10-29 NOTE — Progress Notes (Signed)
.    Covid-19 Vaccination Clinic  Name:  JAYRA CHOYCE    MRN: [id number] DOB: 02/22/1941  10/29/2020  Ms. Guerin was observed post Covid-19 immunization for 15 minutes without incident. She was provided with Vaccine Information Sheet and instruction to access the V-Safe system.   Ms. Schwoerer was instructed to call 911 with any severe reactions post vaccine: Marland Kitchen Difficulty breathing  . Swelling of face and throat  . A fast heartbeat  . A bad rash all over body  . Dizziness and weakness

## 2020-11-02 ENCOUNTER — Other Ambulatory Visit (HOSPITAL_BASED_OUTPATIENT_CLINIC_OR_DEPARTMENT_OTHER): Payer: Self-pay

## 2020-11-02 MED ORDER — PFIZER-BIONT COVID-19 VAC-TRIS 30 MCG/0.3ML IM SUSP
INTRAMUSCULAR | 0 refills | Status: DC
  Filled 2020-11-02: qty 0.3, 1d supply, fill #0

## 2020-11-06 ENCOUNTER — Other Ambulatory Visit (HOSPITAL_BASED_OUTPATIENT_CLINIC_OR_DEPARTMENT_OTHER): Payer: Self-pay

## 2020-11-09 DIAGNOSIS — H10411 Chronic giant papillary conjunctivitis, right eye: Secondary | ICD-10-CM | POA: Diagnosis not present

## 2020-12-17 DIAGNOSIS — L814 Other melanin hyperpigmentation: Secondary | ICD-10-CM | POA: Diagnosis not present

## 2020-12-17 DIAGNOSIS — I8310 Varicose veins of unspecified lower extremity with inflammation: Secondary | ICD-10-CM | POA: Diagnosis not present

## 2020-12-17 DIAGNOSIS — L738 Other specified follicular disorders: Secondary | ICD-10-CM | POA: Diagnosis not present

## 2020-12-17 DIAGNOSIS — L821 Other seborrheic keratosis: Secondary | ICD-10-CM | POA: Diagnosis not present

## 2020-12-17 DIAGNOSIS — L905 Scar conditions and fibrosis of skin: Secondary | ICD-10-CM | POA: Diagnosis not present

## 2020-12-17 DIAGNOSIS — Z85820 Personal history of malignant melanoma of skin: Secondary | ICD-10-CM | POA: Diagnosis not present

## 2020-12-17 DIAGNOSIS — D229 Melanocytic nevi, unspecified: Secondary | ICD-10-CM | POA: Diagnosis not present

## 2020-12-17 DIAGNOSIS — Z85828 Personal history of other malignant neoplasm of skin: Secondary | ICD-10-CM | POA: Diagnosis not present

## 2021-03-04 ENCOUNTER — Other Ambulatory Visit: Payer: Self-pay

## 2021-03-04 ENCOUNTER — Ambulatory Visit (INDEPENDENT_AMBULATORY_CARE_PROVIDER_SITE_OTHER)
Admission: RE | Admit: 2021-03-04 | Discharge: 2021-03-04 | Disposition: A | Discharge status: Home / Self Care | Payer: Medicare Other | Source: Ambulatory Visit | Attending: Pulmonary Disease | Admitting: Pulmonary Disease

## 2021-03-04 DIAGNOSIS — R911 Solitary pulmonary nodule: Secondary | ICD-10-CM | POA: Diagnosis not present

## 2021-03-04 DIAGNOSIS — R918 Other nonspecific abnormal finding of lung field: Secondary | ICD-10-CM | POA: Diagnosis not present

## 2021-03-04 DIAGNOSIS — I700 Atherosclerosis of aorta: Secondary | ICD-10-CM | POA: Diagnosis not present

## 2021-03-08 DIAGNOSIS — H15101 Unspecified episcleritis, right eye: Secondary | ICD-10-CM | POA: Diagnosis not present

## 2021-03-14 DIAGNOSIS — Z23 Encounter for immunization: Secondary | ICD-10-CM | POA: Diagnosis not present

## 2021-03-18 ENCOUNTER — Ambulatory Visit: Payer: Medicare Other | Attending: Internal Medicine

## 2021-03-18 DIAGNOSIS — Z23 Encounter for immunization: Secondary | ICD-10-CM

## 2021-03-18 NOTE — Progress Notes (Signed)
   Covid-19 Vaccination Clinic  Name:  Sara Bryan    MRN: [id number] DOB: 24-Dec-1940  03/18/2021  [name] was observed post Covid-19 immunization for 15 minutes without incident. She was provided with Vaccine Information Sheet and instruction to access the V-Safe system.   [name] was instructed to call 911 with any severe reactions post vaccine: Difficulty breathing  Swelling of face and throat  A fast heartbeat  A bad rash all over body  Dizziness and weakness

## 2021-03-26 ENCOUNTER — Other Ambulatory Visit (HOSPITAL_BASED_OUTPATIENT_CLINIC_OR_DEPARTMENT_OTHER): Payer: Self-pay

## 2021-03-26 MED ORDER — PFIZER COVID-19 VAC BIVALENT 30 MCG/0.3ML IM SUSP
INTRAMUSCULAR | 0 refills | Status: DC
  Filled 2021-03-26: qty 0.3, 1d supply, fill #0

## 2021-06-05 DIAGNOSIS — H15101 Unspecified episcleritis, right eye: Secondary | ICD-10-CM | POA: Diagnosis not present

## 2021-06-14 DIAGNOSIS — Z1231 Encounter for screening mammogram for malignant neoplasm of breast: Secondary | ICD-10-CM | POA: Diagnosis not present

## 2021-06-18 DIAGNOSIS — M1711 Unilateral primary osteoarthritis, right knee: Secondary | ICD-10-CM | POA: Diagnosis not present

## 2021-06-18 DIAGNOSIS — M25561 Pain in right knee: Secondary | ICD-10-CM | POA: Diagnosis not present

## 2021-06-19 DIAGNOSIS — H15101 Unspecified episcleritis, right eye: Secondary | ICD-10-CM | POA: Diagnosis not present

## 2021-06-26 DIAGNOSIS — D225 Melanocytic nevi of trunk: Secondary | ICD-10-CM | POA: Diagnosis not present

## 2021-06-26 DIAGNOSIS — L03115 Cellulitis of right lower limb: Secondary | ICD-10-CM | POA: Diagnosis not present

## 2021-06-26 DIAGNOSIS — L84 Corns and callosities: Secondary | ICD-10-CM | POA: Diagnosis not present

## 2021-06-26 DIAGNOSIS — L814 Other melanin hyperpigmentation: Secondary | ICD-10-CM | POA: Diagnosis not present

## 2021-06-26 DIAGNOSIS — L570 Actinic keratosis: Secondary | ICD-10-CM | POA: Diagnosis not present

## 2021-06-26 DIAGNOSIS — Z08 Encounter for follow-up examination after completed treatment for malignant neoplasm: Secondary | ICD-10-CM | POA: Diagnosis not present

## 2021-06-26 DIAGNOSIS — Z85820 Personal history of malignant melanoma of skin: Secondary | ICD-10-CM | POA: Diagnosis not present

## 2021-06-26 DIAGNOSIS — Z85828 Personal history of other malignant neoplasm of skin: Secondary | ICD-10-CM | POA: Diagnosis not present

## 2021-06-26 DIAGNOSIS — L821 Other seborrheic keratosis: Secondary | ICD-10-CM | POA: Diagnosis not present

## 2021-06-27 DIAGNOSIS — Z0000 Encounter for general adult medical examination without abnormal findings: Secondary | ICD-10-CM | POA: Diagnosis not present

## 2021-06-27 DIAGNOSIS — E782 Mixed hyperlipidemia: Secondary | ICD-10-CM | POA: Diagnosis not present

## 2021-06-27 DIAGNOSIS — L03115 Cellulitis of right lower limb: Secondary | ICD-10-CM | POA: Diagnosis not present

## 2021-06-27 DIAGNOSIS — R911 Solitary pulmonary nodule: Secondary | ICD-10-CM | POA: Diagnosis not present

## 2021-06-27 DIAGNOSIS — Z1211 Encounter for screening for malignant neoplasm of colon: Secondary | ICD-10-CM | POA: Diagnosis not present

## 2021-06-27 DIAGNOSIS — E039 Hypothyroidism, unspecified: Secondary | ICD-10-CM | POA: Diagnosis not present

## 2021-06-27 DIAGNOSIS — M1711 Unilateral primary osteoarthritis, right knee: Secondary | ICD-10-CM | POA: Diagnosis not present

## 2021-06-27 DIAGNOSIS — Z1389 Encounter for screening for other disorder: Secondary | ICD-10-CM | POA: Diagnosis not present

## 2021-06-27 DIAGNOSIS — Z8616 Personal history of COVID-19: Secondary | ICD-10-CM | POA: Diagnosis not present

## 2021-06-27 DIAGNOSIS — I2510 Atherosclerotic heart disease of native coronary artery without angina pectoris: Secondary | ICD-10-CM | POA: Diagnosis not present

## 2021-06-27 DIAGNOSIS — M85852 Other specified disorders of bone density and structure, left thigh: Secondary | ICD-10-CM | POA: Diagnosis not present

## 2021-06-27 DIAGNOSIS — E559 Vitamin D deficiency, unspecified: Secondary | ICD-10-CM | POA: Diagnosis not present

## 2021-07-04 DIAGNOSIS — I872 Venous insufficiency (chronic) (peripheral): Secondary | ICD-10-CM | POA: Diagnosis not present

## 2021-07-04 DIAGNOSIS — Z85820 Personal history of malignant melanoma of skin: Secondary | ICD-10-CM | POA: Diagnosis not present

## 2021-07-04 DIAGNOSIS — R202 Paresthesia of skin: Secondary | ICD-10-CM | POA: Diagnosis not present

## 2021-07-04 DIAGNOSIS — Z08 Encounter for follow-up examination after completed treatment for malignant neoplasm: Secondary | ICD-10-CM | POA: Diagnosis not present

## 2021-07-04 DIAGNOSIS — Z85828 Personal history of other malignant neoplasm of skin: Secondary | ICD-10-CM | POA: Diagnosis not present

## 2021-07-17 DIAGNOSIS — H52203 Unspecified astigmatism, bilateral: Secondary | ICD-10-CM | POA: Diagnosis not present

## 2021-07-17 DIAGNOSIS — Z961 Presence of intraocular lens: Secondary | ICD-10-CM | POA: Diagnosis not present

## 2021-07-17 DIAGNOSIS — D3132 Benign neoplasm of left choroid: Secondary | ICD-10-CM | POA: Diagnosis not present

## 2021-07-17 DIAGNOSIS — H04123 Dry eye syndrome of bilateral lacrimal glands: Secondary | ICD-10-CM | POA: Diagnosis not present

## 2021-08-06 ENCOUNTER — Other Ambulatory Visit: Payer: Self-pay

## 2021-08-06 ENCOUNTER — Inpatient Hospital Stay: Payer: Medicare Other | Admitting: Hematology & Oncology

## 2021-08-06 ENCOUNTER — Inpatient Hospital Stay: Payer: Medicare Other | Attending: Hematology & Oncology

## 2021-08-06 ENCOUNTER — Encounter: Payer: Self-pay | Admitting: Hematology & Oncology

## 2021-08-06 VITALS — BP 138/58 | HR 65 | Temp 97.5°F | Resp 18 | Ht 65.5 in | Wt 189.0 lb

## 2021-08-06 DIAGNOSIS — Z885 Allergy status to narcotic agent status: Secondary | ICD-10-CM | POA: Diagnosis not present

## 2021-08-06 DIAGNOSIS — C4361 Malignant melanoma of right upper limb, including shoulder: Secondary | ICD-10-CM | POA: Insufficient documentation

## 2021-08-06 DIAGNOSIS — Z79899 Other long term (current) drug therapy: Secondary | ICD-10-CM | POA: Diagnosis not present

## 2021-08-06 DIAGNOSIS — Z886 Allergy status to analgesic agent status: Secondary | ICD-10-CM | POA: Diagnosis not present

## 2021-08-06 DIAGNOSIS — Z881 Allergy status to other antibiotic agents status: Secondary | ICD-10-CM | POA: Insufficient documentation

## 2021-08-06 DIAGNOSIS — R12 Heartburn: Secondary | ICD-10-CM | POA: Diagnosis not present

## 2021-08-06 LAB — CMP (CANCER CENTER ONLY)
ALT: 21 U/L (ref 0–44)
AST: 22 U/L (ref 15–41)
Albumin: 3.7 g/dL (ref 3.5–5.0)
Alkaline Phosphatase: 71 U/L (ref 38–126)
Anion gap: 7 (ref 5–15)
BUN: 25 mg/dL — ABNORMAL HIGH (ref 8–23)
CO2: 30 mmol/L (ref 22–32)
Calcium: 9.3 mg/dL (ref 8.9–10.3)
Chloride: 102 mmol/L (ref 98–111)
Creatinine: 0.73 mg/dL (ref 0.44–1.00)
GFR, Estimated: >60 mL/min (ref >60)
Glucose, Bld: 104 mg/dL — ABNORMAL HIGH (ref 70–99)
Potassium: 4.6 mmol/L (ref 3.5–5.1)
Sodium: 139 mmol/L (ref 135–145)
Total Bilirubin: 1 mg/dL (ref 0.3–1.2)
Total Protein: 7.2 g/dL (ref 6.5–8.1)

## 2021-08-06 LAB — CBC WITH DIFFERENTIAL (CANCER CENTER ONLY)
Abs Immature Granulocytes: 0.04 10*3/uL (ref 0.00–0.07)
Basophils Absolute: 0.1 10*3/uL (ref 0.0–0.1)
Basophils Relative: 1 %
Eosinophils Absolute: 0.3 10*3/uL (ref 0.0–0.5)
Eosinophils Relative: 3 %
HCT: 47 % — ABNORMAL HIGH (ref 36.0–46.0)
Hemoglobin: 15.6 g/dL — ABNORMAL HIGH (ref 12.0–15.0)
Immature Granulocytes: 0 %
Lymphocytes Relative: 23 %
Lymphs Abs: 2.3 10*3/uL (ref 0.7–4.0)
MCH: 30.5 pg (ref 26.0–34.0)
MCHC: 33.2 g/dL (ref 30.0–36.0)
MCV: 92 fL (ref 80.0–100.0)
Monocytes Absolute: 1.1 10*3/uL — ABNORMAL HIGH (ref 0.1–1.0)
Monocytes Relative: 11 %
Neutro Abs: 6.2 10*3/uL (ref 1.7–7.7)
Neutrophils Relative %: 62 %
Platelet Count: 291 10*3/uL (ref 150–400)
RBC: 5.11 MIL/uL (ref 3.87–5.11)
RDW: 13 % (ref 11.5–15.5)
WBC Count: 10 10*3/uL (ref 4.0–10.5)
nRBC: 0 % (ref 0.0–0.2)

## 2021-08-06 LAB — LACTATE DEHYDROGENASE: LDH: 190 U/L (ref 98–192)

## 2021-08-06 NOTE — Progress Notes (Signed)
Hematology and Oncology Follow Up Visit  Sara Bryan [id number] May 30, 1941 81 y.o. 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 is here today for follow-up.  She is having problems with her right knee.  She has a brace on.  She sees Dr. Dorna Leitz of [facility].  She is trying to avoid surgery.  I am sure that he will do a great job trying to help her out..  She also has a healing lesion on the right lower leg.  This is in the pretibial area.  She is here with her dermatologist tomorrow.  I think she will have a biopsy of this.  I am sure that this is can be some type of nonmelanoma this skin lesion.  She is still working.  She is doing well at work.  She is quite busy.  She has had no change in bowel or bladder habits.  She has had no problems with cough or shortness of breath.    She said that she had a mammogram in January.  She has had no issues with nausea or vomiting.  She is trying to lose a little bit of weight.  She would not travel last year.  Overall, I would say her performance status is ECOG 1.      Medications:  Allergies as of 08/06/2021       Reactions   Levaquin [levofloxacin] Other (See Comments)   Diarrhea/Muscle weakness/inability to lift arms/limbs   Aspirin Nausea Only, Nausea And Vomiting   Enteric is ok   Atorvastatin    Other reaction(s): myalgias   Codeine Nausea Only        Medication List        Accurate as of August 06, 2021  8:11 AM. If you have any questions, ask your nurse or doctor.          acetaminophen 500 MG tablet Commonly known as: TYLENOL Take 500 mg by mouth every 6 (six) hours as needed for mild pain.   aspirin EC 81 MG tablet Take 81 mg by mouth daily.   b complex vitamins tablet Take 1 tablet by mouth daily.   CALCIUM PO Take 1,000 mg by mouth daily.   cetirizine 10 MG tablet Commonly  known as: ZYRTEC Take 10 mg by mouth daily as needed for allergies.   CHOLECALCIFEROL PO Take 6,000 Units by mouth daily.   CO Q-10 PO Take 100 mg by mouth daily.   fish oil-omega-3 fatty acids 1000 MG capsule Take 1 g by mouth daily.   fluorometholone 0.1 % ophthalmic suspension Commonly known as: FML   ibuprofen 200 MG tablet Commonly known as: ADVIL Take 200 mg by mouth every 6 (six) hours as needed for moderate pain.   levothyroxine 50 MCG tablet Commonly known as: SYNTHROID Take 50 mcg by mouth daily.   multivitamins ther. w/minerals Tabs tablet Take 1 tablet by mouth daily.   Pfizer COVID-19 Vac Bivalent injection Generic drug: COVID-19 mRNA bivalent vaccine Therapist, music) Inject into the muscle.   Pfizer-BioNT COVID-19 Vac-TriS Susp injection Generic drug: COVID-19 mRNA Vac-TriS (Pfizer) Inject into the muscle.   PROBIOTIC DAILY PO Take 1 tablet by mouth daily.   vitamin C 500 MG tablet Commonly known as: ASCORBIC ACID Take 500 mg by mouth daily.        Allergies:  Allergies  Allergen Reactions  Levaquin [Levofloxacin] Other (See Comments)    Diarrhea/Muscle weakness/inability to lift arms/limbs   Aspirin Nausea Only and Nausea And Vomiting    Enteric is ok   Atorvastatin     Other reaction(s): myalgias   Codeine Nausea Only    Past Medical History, Surgical history, Social history, and Family History were reviewed and updated.  Review of Systems: Review of Systems  Constitutional: Negative.   HENT: Negative.    Eyes: Negative.   Respiratory: Negative.    Cardiovascular: Negative.   Gastrointestinal:  Positive for heartburn.  Genitourinary: Negative.   Musculoskeletal: Negative.   Skin: Negative.   Neurological: Negative.   Endo/Heme/Allergies: Negative.   Psychiatric/Behavioral: Negative.     Physical Exam:  height is 5' 5.5" (1.664 m) and weight is 189 lb (85.7 kg). Her oral temperature is 97.5 F (36.4 C) (abnormal). Her blood pressure  is 138/58 (abnormal) and her pulse is 65. Her respiration is 18 and oxygen saturation is 99%.   Wt Readings from Last 3 Encounters:  08/06/21 189 lb (85.7 kg)  08/06/20 193 lb (87.5 kg)  08/01/19 207 lb (93.9 kg)    Physical Exam Vitals reviewed.  HENT:     Head: Normocephalic and atraumatic.  Eyes:     Pupils: Pupils are equal, round, and reactive to light.  Cardiovascular:     Rate and Rhythm: Normal rate and regular rhythm.     Heart sounds: Normal heart sounds.  Pulmonary:     Effort: Pulmonary effort is normal.     Breath sounds: Normal breath sounds.  Abdominal:     General: Bowel sounds are normal.     Palpations: Abdomen is soft.  Musculoskeletal:        General: No tenderness or deformity. Normal range of motion.     Cervical back: Normal range of motion.  Lymphadenopathy:     Cervical: No cervical adenopathy.  Skin:    General: Skin is warm and dry.     Findings: No erythema or rash.  Neurological:     Mental Status: She is alert and oriented to person, place, and time.  Psychiatric:        Behavior: Behavior normal.        Thought Content: Thought content normal.        Judgment: Judgment normal.     Lab Results  Component Value Date   WBC 10.0 08/06/2021   HGB 15.6 (H) 08/06/2021   HCT 47.0 (H) 08/06/2021   MCV 92.0 08/06/2021   PLT 291 08/06/2021   No results found for: FERRITIN, IRON, TIBC, UIBC, IRONPCTSAT Lab Results  Component Value Date   RBC 5.11 08/06/2021   No results found for: KPAFRELGTCHN, LAMBDASER, KAPLAMBRATIO No results found for: IGGSERUM, IGA, IGMSERUM No results found for: Odetta Pink, SPEI   Chemistry      Component Value Date/Time   NA 138 08/06/2020 0758   NA 133 (L) 08/04/2016 1459   NA 142 02/26/2016 0921   K 4.1 08/06/2020 0758   K 4.0 08/04/2016 1459   K 4.1 02/26/2016 0921   CL 102 08/06/2020 0758   CL 103 08/04/2016 1459   CL 105 02/26/2015 0807   CO2 30  08/06/2020 0758   CO2 26 08/04/2016 1459   CO2 25 02/26/2016 0921   BUN 22 08/06/2020 0758   BUN 18 08/04/2016 1459   BUN 18.0 02/26/2016 0921   CREATININE 0.72 08/06/2020 0758   CREATININE 0.64 08/04/2016  1459   CREATININE 0.8 02/26/2016 0921      Component Value Date/Time   CALCIUM 9.6 08/06/2020 0758   CALCIUM 9.4 08/04/2016 1459   CALCIUM 9.7 02/26/2016 0921   ALKPHOS 73 08/06/2020 0758   ALKPHOS 80 08/04/2016 1459   ALKPHOS 75 02/26/2016 0921   AST 18 08/06/2020 0758   AST 23 02/26/2016 0921   ALT 16 08/06/2020 0758   ALT 25 02/26/2016 0921   BILITOT 1.4 (H) 08/06/2020 0758   BILITOT 1.62 (H) 02/26/2016 0921     Impression and Plan: Ms. Sahota is 81 yo white female with history of a stage IB melanoma of the right arm resection back in November 2012. So far, she has done well and there has been no evidence of recurrence.   I do not see any issues if she does need to have surgery for the right knee.  There is been no problems with the melanoma.  She is now out over 10 years from the resection.  We still like to follow along yearly.  I will have her come back in 1 year.    [name], MD 2/28/20238:11 AM

## 2021-08-07 DIAGNOSIS — D492 Neoplasm of unspecified behavior of bone, soft tissue, and skin: Secondary | ICD-10-CM | POA: Diagnosis not present

## 2021-08-07 DIAGNOSIS — C44722 Squamous cell carcinoma of skin of right lower limb, including hip: Secondary | ICD-10-CM | POA: Diagnosis not present

## 2021-09-18 DIAGNOSIS — I872 Venous insufficiency (chronic) (peripheral): Secondary | ICD-10-CM | POA: Diagnosis not present

## 2021-09-18 DIAGNOSIS — C44722 Squamous cell carcinoma of skin of right lower limb, including hip: Secondary | ICD-10-CM | POA: Diagnosis not present

## 2021-09-24 DIAGNOSIS — Z4801 Encounter for change or removal of surgical wound dressing: Secondary | ICD-10-CM | POA: Diagnosis not present

## 2021-10-07 DIAGNOSIS — I872 Venous insufficiency (chronic) (peripheral): Secondary | ICD-10-CM | POA: Diagnosis not present

## 2021-10-21 DIAGNOSIS — I872 Venous insufficiency (chronic) (peripheral): Secondary | ICD-10-CM | POA: Diagnosis not present

## 2021-10-31 DIAGNOSIS — I872 Venous insufficiency (chronic) (peripheral): Secondary | ICD-10-CM | POA: Diagnosis not present

## 2021-11-12 DIAGNOSIS — Z48817 Encounter for surgical aftercare following surgery on the skin and subcutaneous tissue: Secondary | ICD-10-CM | POA: Diagnosis not present

## 2021-11-27 DIAGNOSIS — S81801A Unspecified open wound, right lower leg, initial encounter: Secondary | ICD-10-CM | POA: Diagnosis not present

## 2021-12-17 DIAGNOSIS — M79642 Pain in left hand: Secondary | ICD-10-CM | POA: Diagnosis not present

## 2021-12-24 DIAGNOSIS — L821 Other seborrheic keratosis: Secondary | ICD-10-CM | POA: Diagnosis not present

## 2021-12-24 DIAGNOSIS — Z08 Encounter for follow-up examination after completed treatment for malignant neoplasm: Secondary | ICD-10-CM | POA: Diagnosis not present

## 2021-12-24 DIAGNOSIS — L814 Other melanin hyperpigmentation: Secondary | ICD-10-CM | POA: Diagnosis not present

## 2021-12-24 DIAGNOSIS — Z85820 Personal history of malignant melanoma of skin: Secondary | ICD-10-CM | POA: Diagnosis not present

## 2021-12-24 DIAGNOSIS — M1812 Unilateral primary osteoarthritis of first carpometacarpal joint, left hand: Secondary | ICD-10-CM | POA: Diagnosis not present

## 2021-12-24 DIAGNOSIS — Z85828 Personal history of other malignant neoplasm of skin: Secondary | ICD-10-CM | POA: Diagnosis not present

## 2021-12-24 DIAGNOSIS — D225 Melanocytic nevi of trunk: Secondary | ICD-10-CM | POA: Diagnosis not present

## 2021-12-24 DIAGNOSIS — I872 Venous insufficiency (chronic) (peripheral): Secondary | ICD-10-CM | POA: Diagnosis not present

## 2021-12-27 DIAGNOSIS — S81801A Unspecified open wound, right lower leg, initial encounter: Secondary | ICD-10-CM | POA: Diagnosis not present

## 2022-01-10 DIAGNOSIS — M1812 Unilateral primary osteoarthritis of first carpometacarpal joint, left hand: Secondary | ICD-10-CM | POA: Diagnosis not present

## 2022-01-27 DIAGNOSIS — S81801A Unspecified open wound, right lower leg, initial encounter: Secondary | ICD-10-CM | POA: Diagnosis not present

## 2022-01-27 DIAGNOSIS — S81811A Laceration without foreign body, right lower leg, initial encounter: Secondary | ICD-10-CM | POA: Diagnosis not present

## 2022-01-28 DIAGNOSIS — M1711 Unilateral primary osteoarthritis, right knee: Secondary | ICD-10-CM | POA: Diagnosis not present

## 2022-01-31 DIAGNOSIS — R911 Solitary pulmonary nodule: Secondary | ICD-10-CM | POA: Diagnosis not present

## 2022-01-31 DIAGNOSIS — M1711 Unilateral primary osteoarthritis, right knee: Secondary | ICD-10-CM | POA: Diagnosis not present

## 2022-01-31 DIAGNOSIS — Z85820 Personal history of malignant melanoma of skin: Secondary | ICD-10-CM | POA: Diagnosis not present

## 2022-01-31 DIAGNOSIS — E559 Vitamin D deficiency, unspecified: Secondary | ICD-10-CM | POA: Diagnosis not present

## 2022-01-31 DIAGNOSIS — I2510 Atherosclerotic heart disease of native coronary artery without angina pectoris: Secondary | ICD-10-CM | POA: Diagnosis not present

## 2022-01-31 DIAGNOSIS — M85852 Other specified disorders of bone density and structure, left thigh: Secondary | ICD-10-CM | POA: Diagnosis not present

## 2022-01-31 DIAGNOSIS — E039 Hypothyroidism, unspecified: Secondary | ICD-10-CM | POA: Diagnosis not present

## 2022-01-31 DIAGNOSIS — E782 Mixed hyperlipidemia: Secondary | ICD-10-CM | POA: Diagnosis not present

## 2022-02-27 DIAGNOSIS — S81801A Unspecified open wound, right lower leg, initial encounter: Secondary | ICD-10-CM | POA: Diagnosis not present

## 2022-03-10 DIAGNOSIS — Z23 Encounter for immunization: Secondary | ICD-10-CM | POA: Diagnosis not present

## 2022-03-31 ENCOUNTER — Other Ambulatory Visit (HOSPITAL_BASED_OUTPATIENT_CLINIC_OR_DEPARTMENT_OTHER): Payer: Self-pay

## 2022-03-31 DIAGNOSIS — S81801A Unspecified open wound, right lower leg, initial encounter: Secondary | ICD-10-CM | POA: Diagnosis not present

## 2022-03-31 MED ORDER — COMIRNATY 30 MCG/0.3ML IM SUSY
PREFILLED_SYRINGE | INTRAMUSCULAR | 0 refills | Status: DC
  Filled 2022-03-31: qty 0.3, 1d supply, fill #0

## 2022-04-28 DIAGNOSIS — L821 Other seborrheic keratosis: Secondary | ICD-10-CM | POA: Diagnosis not present

## 2022-04-28 DIAGNOSIS — L905 Scar conditions and fibrosis of skin: Secondary | ICD-10-CM | POA: Diagnosis not present

## 2022-04-28 DIAGNOSIS — D225 Melanocytic nevi of trunk: Secondary | ICD-10-CM | POA: Diagnosis not present

## 2022-04-28 DIAGNOSIS — Z85828 Personal history of other malignant neoplasm of skin: Secondary | ICD-10-CM | POA: Diagnosis not present

## 2022-04-28 DIAGNOSIS — Z08 Encounter for follow-up examination after completed treatment for malignant neoplasm: Secondary | ICD-10-CM | POA: Diagnosis not present

## 2022-04-28 DIAGNOSIS — Z85820 Personal history of malignant melanoma of skin: Secondary | ICD-10-CM | POA: Diagnosis not present

## 2022-04-28 DIAGNOSIS — L814 Other melanin hyperpigmentation: Secondary | ICD-10-CM | POA: Diagnosis not present

## 2022-04-30 ENCOUNTER — Other Ambulatory Visit (HOSPITAL_BASED_OUTPATIENT_CLINIC_OR_DEPARTMENT_OTHER): Payer: Self-pay

## 2022-04-30 MED ORDER — AREXVY 120 MCG/0.5ML IM SUSR
INTRAMUSCULAR | 0 refills | Status: DC
  Filled 2022-04-30: qty 1, 1d supply, fill #0

## 2022-06-16 DIAGNOSIS — L538 Other specified erythematous conditions: Secondary | ICD-10-CM | POA: Diagnosis not present

## 2022-06-16 DIAGNOSIS — L820 Inflamed seborrheic keratosis: Secondary | ICD-10-CM | POA: Diagnosis not present

## 2022-06-16 DIAGNOSIS — D492 Neoplasm of unspecified behavior of bone, soft tissue, and skin: Secondary | ICD-10-CM | POA: Diagnosis not present

## 2022-07-01 DIAGNOSIS — Z780 Asymptomatic menopausal state: Secondary | ICD-10-CM | POA: Diagnosis not present

## 2022-07-01 DIAGNOSIS — Z803 Family history of malignant neoplasm of breast: Secondary | ICD-10-CM | POA: Diagnosis not present

## 2022-07-01 DIAGNOSIS — M8589 Other specified disorders of bone density and structure, multiple sites: Secondary | ICD-10-CM | POA: Diagnosis not present

## 2022-07-01 DIAGNOSIS — Z1231 Encounter for screening mammogram for malignant neoplasm of breast: Secondary | ICD-10-CM | POA: Diagnosis not present

## 2022-07-23 DIAGNOSIS — D3132 Benign neoplasm of left choroid: Secondary | ICD-10-CM | POA: Diagnosis not present

## 2022-07-23 DIAGNOSIS — H52203 Unspecified astigmatism, bilateral: Secondary | ICD-10-CM | POA: Diagnosis not present

## 2022-07-23 DIAGNOSIS — Z961 Presence of intraocular lens: Secondary | ICD-10-CM | POA: Diagnosis not present

## 2022-07-31 DIAGNOSIS — R911 Solitary pulmonary nodule: Secondary | ICD-10-CM | POA: Diagnosis not present

## 2022-07-31 DIAGNOSIS — R252 Cramp and spasm: Secondary | ICD-10-CM | POA: Diagnosis not present

## 2022-07-31 DIAGNOSIS — I2510 Atherosclerotic heart disease of native coronary artery without angina pectoris: Secondary | ICD-10-CM | POA: Diagnosis not present

## 2022-07-31 DIAGNOSIS — M1711 Unilateral primary osteoarthritis, right knee: Secondary | ICD-10-CM | POA: Diagnosis not present

## 2022-07-31 DIAGNOSIS — E039 Hypothyroidism, unspecified: Secondary | ICD-10-CM | POA: Diagnosis not present

## 2022-07-31 DIAGNOSIS — E782 Mixed hyperlipidemia: Secondary | ICD-10-CM | POA: Diagnosis not present

## 2022-07-31 DIAGNOSIS — Z85820 Personal history of malignant melanoma of skin: Secondary | ICD-10-CM | POA: Diagnosis not present

## 2022-07-31 DIAGNOSIS — E559 Vitamin D deficiency, unspecified: Secondary | ICD-10-CM | POA: Diagnosis not present

## 2022-07-31 DIAGNOSIS — Z0000 Encounter for general adult medical examination without abnormal findings: Secondary | ICD-10-CM | POA: Diagnosis not present

## 2022-07-31 DIAGNOSIS — M85852 Other specified disorders of bone density and structure, left thigh: Secondary | ICD-10-CM | POA: Diagnosis not present

## 2022-07-31 DIAGNOSIS — Z23 Encounter for immunization: Secondary | ICD-10-CM | POA: Diagnosis not present

## 2022-08-04 ENCOUNTER — Inpatient Hospital Stay: Payer: Medicare Other | Admitting: Hematology & Oncology

## 2022-08-04 ENCOUNTER — Inpatient Hospital Stay: Payer: Medicare Other

## 2022-08-12 ENCOUNTER — Encounter: Payer: Self-pay | Admitting: Medical Oncology

## 2022-08-12 ENCOUNTER — Inpatient Hospital Stay: Payer: Medicare Other | Attending: Hematology & Oncology

## 2022-08-12 ENCOUNTER — Other Ambulatory Visit: Payer: Self-pay

## 2022-08-12 ENCOUNTER — Inpatient Hospital Stay (HOSPITAL_BASED_OUTPATIENT_CLINIC_OR_DEPARTMENT_OTHER): Payer: Medicare Other | Admitting: Medical Oncology

## 2022-08-12 VITALS — BP 137/49 | HR 60 | Temp 98.1°F | Resp 19 | Ht 65.35 in | Wt 190.4 lb

## 2022-08-12 DIAGNOSIS — C4361 Malignant melanoma of right upper limb, including shoulder: Secondary | ICD-10-CM | POA: Diagnosis not present

## 2022-08-12 DIAGNOSIS — Z85520 Personal history of malignant carcinoid tumor of kidney: Secondary | ICD-10-CM | POA: Insufficient documentation

## 2022-08-12 LAB — CBC WITH DIFFERENTIAL (CANCER CENTER ONLY)
Abs Immature Granulocytes: 0.03 10*3/uL (ref 0.00–0.07)
Basophils Absolute: 0.1 10*3/uL (ref 0.0–0.1)
Basophils Relative: 1 %
Eosinophils Absolute: 0.3 10*3/uL (ref 0.0–0.5)
Eosinophils Relative: 3 %
HCT: 46.9 % — ABNORMAL HIGH (ref 36.0–46.0)
Hemoglobin: 15.2 g/dL — ABNORMAL HIGH (ref 12.0–15.0)
Immature Granulocytes: 0 %
Lymphocytes Relative: 25 %
Lymphs Abs: 2.2 10*3/uL (ref 0.7–4.0)
MCH: 30 pg (ref 26.0–34.0)
MCHC: 32.4 g/dL (ref 30.0–36.0)
MCV: 92.7 fL (ref 80.0–100.0)
Monocytes Absolute: 1 10*3/uL (ref 0.1–1.0)
Monocytes Relative: 11 %
Neutro Abs: 5.4 10*3/uL (ref 1.7–7.7)
Neutrophils Relative %: 60 %
Platelet Count: 264 10*3/uL (ref 150–400)
RBC: 5.06 MIL/uL (ref 3.87–5.11)
RDW: 12.6 % (ref 11.5–15.5)
WBC Count: 8.9 10*3/uL (ref 4.0–10.5)
nRBC: 0 % (ref 0.0–0.2)

## 2022-08-12 LAB — CMP (CANCER CENTER ONLY)
ALT: 19 U/L (ref 0–44)
AST: 21 U/L (ref 15–41)
Albumin: 4 g/dL (ref 3.5–5.0)
Alkaline Phosphatase: 76 U/L (ref 38–126)
Anion gap: 7 (ref 5–15)
BUN: 19 mg/dL (ref 8–23)
CO2: 31 mmol/L (ref 22–32)
Calcium: 9.9 mg/dL (ref 8.9–10.3)
Chloride: 102 mmol/L (ref 98–111)
Creatinine: 0.79 mg/dL (ref 0.44–1.00)
GFR, Estimated: >60 mL/min (ref >60)
Glucose, Bld: 115 mg/dL — ABNORMAL HIGH (ref 70–99)
Potassium: 4.7 mmol/L (ref 3.5–5.1)
Sodium: 140 mmol/L (ref 135–145)
Total Bilirubin: 1.2 mg/dL (ref 0.3–1.2)
Total Protein: 7.5 g/dL (ref 6.5–8.1)

## 2022-08-12 LAB — LACTATE DEHYDROGENASE: LDH: 190 U/L (ref 98–192)

## 2022-08-12 NOTE — Progress Notes (Signed)
Hematology and Oncology Follow Up Visit  KAHLIAH AXELROD [id number] 01-17-1941 82 y.o. 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 Swearengen is here today for follow-up.   She is still working at a [employer]. Knees are maybe a bit better than they have in the past.   She has had no change in bowel or bladder habits.  She has had no problems with cough or shortness of breath.  No unintentional weight loss or night sweats. No GI symptoms or concerns. She has restarted weight watchers. She also wants to increase her walking.   Last mammogram was completed about 2 months ago per patient. Normal   Last TBSE was a few months ago with Dr. Pearline Cables. She goes every 6 months with her next appointment in May.   Followed by Dr. Vaughan Browner for pulmonary nodules. Last CT chest was on 03/04/2021 which showed resolved or unchanged nodules. Has had follow up x 3 years.   Overall, I would say her performance status is ECOG 1.      Wt Readings from Last 3 Encounters:  08/12/22 190 lb 6.4 oz (86.4 kg)  08/06/21 189 lb (85.7 kg)  08/06/20 193 lb (87.5 kg)     Medications:  Allergies as of 08/12/2022       Reactions   Levaquin [levofloxacin] Other (See Comments)   Diarrhea/Muscle weakness/inability to lift arms/limbs   Aspirin Nausea And Vomiting, Nausea Only   Enteric is ok   Atorvastatin Other (See Comments)   Other reaction(s): myalgias   Codeine Nausea Only        Medication List        Accurate as of August 12, 2022  9:24 AM. If you have any questions, ask your nurse or doctor.          STOP taking these medications    Arexvy 120 MCG/0.5ML injection Generic drug: RSV vaccine recomb adjuvanted Stopped by: Hughie Closs, PA-C   Comirnaty syringe Generic drug: COVID-19 mRNA vaccine 2023-2024 Stopped by: Hughie Closs, PA-C   fluorometholone 0.1 % ophthalmic  suspension Commonly known as: FML Stopped by: Hughie Closs, PA-C   Pfizer COVID-19 Vac Bivalent injection Generic drug: COVID-19 mRNA bivalent vaccine Therapist, music) Stopped by: Hughie Closs, PA-C   Pfizer-BioNT COVID-19 Vac-TriS Susp injection Generic drug: COVID-19 mRNA Vac-TriS Therapist, music) Stopped by: Hughie Closs, PA-C       TAKE these medications    acetaminophen 500 MG tablet Commonly known as: TYLENOL Take 500 mg by mouth every 6 (six) hours as needed for mild pain.   ascorbic acid 500 MG tablet Commonly known as: VITAMIN C Take 500 mg by mouth daily.   aspirin EC 81 MG tablet Take 81 mg by mouth daily.   b complex vitamins tablet Take 1 tablet by mouth daily.   CALCIUM PO Take 1,000 mg by mouth daily.   cetirizine 10 MG tablet Commonly known as: ZYRTEC Take 10 mg by mouth daily as needed for allergies.   CHOLECALCIFEROL PO Take 6,000 Units by mouth daily.   CO Q-10 PO Take 100 mg by mouth daily.   fish oil-omega-3 fatty acids 1000 MG capsule Take 1 g by mouth daily.   ibuprofen 200 MG tablet Commonly known as: ADVIL Take 200 mg by mouth every 6 (six) hours as needed for moderate pain.   levothyroxine 50 MCG tablet Commonly  known as: SYNTHROID Take 50 mcg by mouth daily.   multivitamins ther. w/minerals Tabs tablet Take 1 tablet by mouth daily.   PROBIOTIC DAILY PO Take 1 tablet by mouth daily.        Allergies:  Allergies  Allergen Reactions   Levaquin [Levofloxacin] Other (See Comments)    Diarrhea/Muscle weakness/inability to lift arms/limbs   Aspirin Nausea And Vomiting and Nausea Only    Enteric is ok   Atorvastatin Other (See Comments)    Other reaction(s): myalgias   Codeine Nausea Only    Past Medical History, Surgical history, Social history, and Family History were reviewed and updated.  Review of Systems: Review of Systems  Constitutional: Negative.   HENT: Negative.    Eyes: Negative.   Respiratory: Negative.     Cardiovascular: Negative.   Gastrointestinal:  Positive for heartburn.  Genitourinary: Negative.   Musculoskeletal: Negative.   Skin: Negative.   Neurological: Negative.   Endo/Heme/Allergies: Negative.   Psychiatric/Behavioral: Negative.      Physical Exam:  height is 5' 5.35" (1.66 m) and weight is 190 lb 6.4 oz (86.4 kg). Her oral temperature is 98.1 F (36.7 C). Her blood pressure is 137/49 (abnormal) and her pulse is 60. Her respiration is 19 and oxygen saturation is 100%.   Wt Readings from Last 3 Encounters:  08/12/22 190 lb 6.4 oz (86.4 kg)  08/06/21 189 lb (85.7 kg)  08/06/20 193 lb (87.5 kg)    Physical Exam Vitals reviewed.  HENT:     Head: Normocephalic and atraumatic.  Eyes:     Pupils: Pupils are equal, round, and reactive to light.  Cardiovascular:     Rate and Rhythm: Normal rate and regular rhythm.     Heart sounds: Normal heart sounds.  Pulmonary:     Effort: Pulmonary effort is normal.     Breath sounds: Normal breath sounds.  Abdominal:     General: Bowel sounds are normal.     Palpations: Abdomen is soft.  Musculoskeletal:        General: No tenderness or deformity. Normal range of motion.     Cervical back: Normal range of motion.  Lymphadenopathy:     Cervical: No cervical adenopathy.  Skin:    General: Skin is warm and dry.     Findings: No erythema or rash.  Neurological:     Mental Status: She is alert and oriented to person, place, and time.  Psychiatric:        Behavior: Behavior normal.        Thought Content: Thought content normal.        Judgment: Judgment normal.      Lab Results  Component Value Date   WBC 8.9 08/12/2022   HGB 15.2 (H) 08/12/2022   HCT 46.9 (H) 08/12/2022   MCV 92.7 08/12/2022   PLT 264 08/12/2022   No results found for: "FERRITIN", "IRON", "TIBC", "UIBC", "IRONPCTSAT" Lab Results  Component Value Date   RBC 5.06 08/12/2022   No results found for: "KPAFRELGTCHN", "LAMBDASER", "KAPLAMBRATIO" No  results found for: "IGGSERUM", "IGA", "IGMSERUM" No results found for: "TOTALPROTELP", "ALBUMINELP", "A1GS", "A2GS", "BETS", "BETA2SER", "GAMS", "MSPIKE", "SPEI"   Chemistry      Component Value Date/Time   NA 140 08/12/2022 0848   NA 133 (L) 08/04/2016 1459   NA 142 02/26/2016 0921   K 4.7 08/12/2022 0848   K 4.0 08/04/2016 1459   K 4.1 02/26/2016 0921   CL 102 08/12/2022 0848   CL 103  08/04/2016 1459   CL 105 02/26/2015 0807   CO2 31 08/12/2022 0848   CO2 26 08/04/2016 1459   CO2 25 02/26/2016 0921   BUN 19 08/12/2022 0848   BUN 18 08/04/2016 1459   BUN 18.0 02/26/2016 0921   CREATININE 0.79 08/12/2022 0848   CREATININE 0.64 08/04/2016 1459   CREATININE 0.8 02/26/2016 0921      Component Value Date/Time   CALCIUM 9.9 08/12/2022 0848   CALCIUM 9.4 08/04/2016 1459   CALCIUM 9.7 02/26/2016 0921   ALKPHOS 76 08/12/2022 0848   ALKPHOS 80 08/04/2016 1459   ALKPHOS 75 02/26/2016 0921   AST 21 08/12/2022 0848   AST 23 02/26/2016 0921   ALT 19 08/12/2022 0848   ALT 25 02/26/2016 0921   BILITOT 1.2 08/12/2022 0848   BILITOT 1.62 (H) 02/26/2016 0921     Impression and Plan: Ms. Sadek is 82 yo white female with history of a stage IB melanoma of the right arm resection back in November 2012. So far, she has done well and there has been no evidence of recurrence.   Increase walking, reduce carbohydrates. BP a bit elevated today due to stress. She will continue PCP follow up .   Continue Q 6 month follow up with Dermatology. No evidence of recurrence today.   We still like to follow along yearly.  I will have her come back in 1 year.   Hughie Closs, PA-C 3/5/20249:24 AM

## 2022-10-08 DIAGNOSIS — U071 COVID-19: Secondary | ICD-10-CM | POA: Diagnosis not present

## 2022-10-27 DIAGNOSIS — L821 Other seborrheic keratosis: Secondary | ICD-10-CM | POA: Diagnosis not present

## 2022-10-27 DIAGNOSIS — Z85820 Personal history of malignant melanoma of skin: Secondary | ICD-10-CM | POA: Diagnosis not present

## 2022-10-27 DIAGNOSIS — Z08 Encounter for follow-up examination after completed treatment for malignant neoplasm: Secondary | ICD-10-CM | POA: Diagnosis not present

## 2022-10-27 DIAGNOSIS — L905 Scar conditions and fibrosis of skin: Secondary | ICD-10-CM | POA: Diagnosis not present

## 2022-10-27 DIAGNOSIS — L814 Other melanin hyperpigmentation: Secondary | ICD-10-CM | POA: Diagnosis not present

## 2022-10-27 DIAGNOSIS — Z85828 Personal history of other malignant neoplasm of skin: Secondary | ICD-10-CM | POA: Diagnosis not present

## 2022-10-27 DIAGNOSIS — D225 Melanocytic nevi of trunk: Secondary | ICD-10-CM | POA: Diagnosis not present

## 2023-01-29 DIAGNOSIS — M85852 Other specified disorders of bone density and structure, left thigh: Secondary | ICD-10-CM | POA: Diagnosis not present

## 2023-01-29 DIAGNOSIS — E782 Mixed hyperlipidemia: Secondary | ICD-10-CM | POA: Diagnosis not present

## 2023-01-29 DIAGNOSIS — E039 Hypothyroidism, unspecified: Secondary | ICD-10-CM | POA: Diagnosis not present

## 2023-01-29 DIAGNOSIS — M1711 Unilateral primary osteoarthritis, right knee: Secondary | ICD-10-CM | POA: Diagnosis not present

## 2023-01-29 DIAGNOSIS — E559 Vitamin D deficiency, unspecified: Secondary | ICD-10-CM | POA: Diagnosis not present

## 2023-01-29 DIAGNOSIS — M545 Low back pain, unspecified: Secondary | ICD-10-CM | POA: Diagnosis not present

## 2023-01-29 DIAGNOSIS — I2510 Atherosclerotic heart disease of native coronary artery without angina pectoris: Secondary | ICD-10-CM | POA: Diagnosis not present

## 2023-01-29 DIAGNOSIS — R911 Solitary pulmonary nodule: Secondary | ICD-10-CM | POA: Diagnosis not present

## 2023-01-29 DIAGNOSIS — Z85820 Personal history of malignant melanoma of skin: Secondary | ICD-10-CM | POA: Diagnosis not present

## 2023-03-12 DIAGNOSIS — H0100A Unspecified blepharitis right eye, upper and lower eyelids: Secondary | ICD-10-CM | POA: Diagnosis not present

## 2023-03-30 ENCOUNTER — Other Ambulatory Visit (HOSPITAL_BASED_OUTPATIENT_CLINIC_OR_DEPARTMENT_OTHER): Payer: Self-pay

## 2023-03-30 MED ORDER — COMIRNATY 30 MCG/0.3ML IM SUSY
0.3000 mL | PREFILLED_SYRINGE | Freq: Once | INTRAMUSCULAR | 0 refills | Status: AC
  Filled 2023-03-30: qty 0.3, 1d supply, fill #0

## 2023-03-30 MED ORDER — FLUAD 0.5 ML IM SUSY
0.5000 mL | PREFILLED_SYRINGE | Freq: Once | INTRAMUSCULAR | 0 refills | Status: AC
  Filled 2023-03-30: qty 0.5, 1d supply, fill #0

## 2023-04-13 DIAGNOSIS — S60572A Other superficial bite of hand of left hand, initial encounter: Secondary | ICD-10-CM | POA: Diagnosis not present

## 2023-04-13 DIAGNOSIS — W5501XA Bitten by cat, initial encounter: Secondary | ICD-10-CM | POA: Diagnosis not present

## 2023-04-30 DIAGNOSIS — L814 Other melanin hyperpigmentation: Secondary | ICD-10-CM | POA: Diagnosis not present

## 2023-04-30 DIAGNOSIS — L905 Scar conditions and fibrosis of skin: Secondary | ICD-10-CM | POA: Diagnosis not present

## 2023-04-30 DIAGNOSIS — D225 Melanocytic nevi of trunk: Secondary | ICD-10-CM | POA: Diagnosis not present

## 2023-04-30 DIAGNOSIS — Z85828 Personal history of other malignant neoplasm of skin: Secondary | ICD-10-CM | POA: Diagnosis not present

## 2023-04-30 DIAGNOSIS — L84 Corns and callosities: Secondary | ICD-10-CM | POA: Diagnosis not present

## 2023-04-30 DIAGNOSIS — Z08 Encounter for follow-up examination after completed treatment for malignant neoplasm: Secondary | ICD-10-CM | POA: Diagnosis not present

## 2023-04-30 DIAGNOSIS — Z85820 Personal history of malignant melanoma of skin: Secondary | ICD-10-CM | POA: Diagnosis not present

## 2023-04-30 DIAGNOSIS — L821 Other seborrheic keratosis: Secondary | ICD-10-CM | POA: Diagnosis not present

## 2023-07-02 IMAGING — CT CT CHEST W/O CM
2 of 4 series · 15 of 36 positions shown, 18 images · non-contrast
Comparison: 03/01/2020 and 03/01/2019.

CLINICAL DATA: Lung nodules.

EXAM:
CT CHEST WITHOUT CONTRAST
TECHNIQUE: Multidetector CT imaging of the chest was performed following the
standard protocol without IV contrast.

[Series 2: thorax · axial · 0.76mm/px · z∈[-292,-46]mm · 12 of 147 slices shown, 15 images]
[im 12/147  mediastinal]
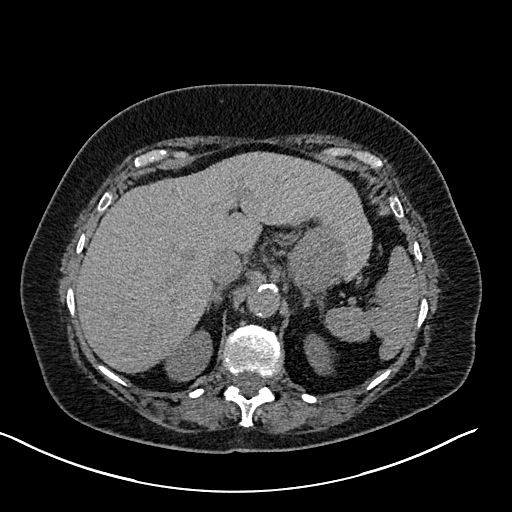
[im 12/147  lung]
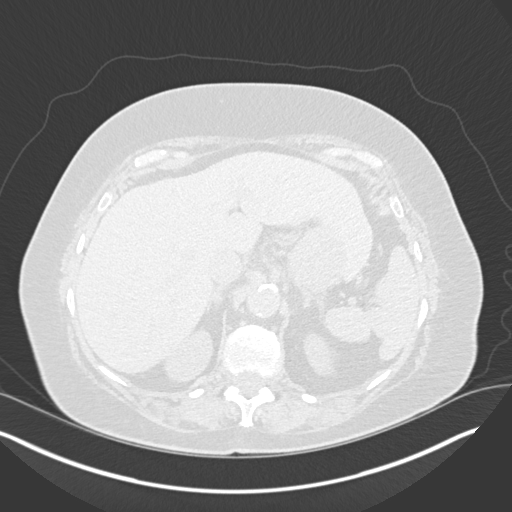
[im 23/147  lung]
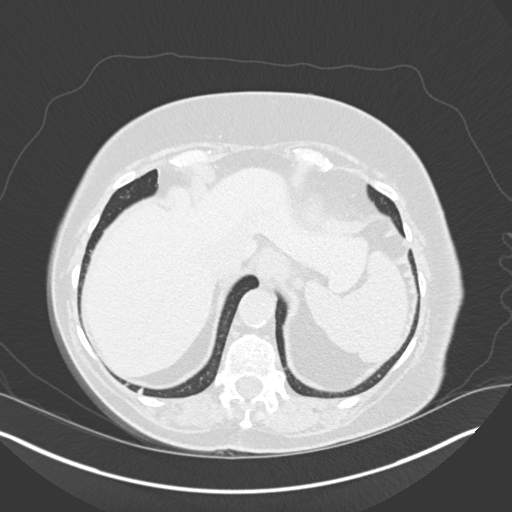
[im 34/147  lung]
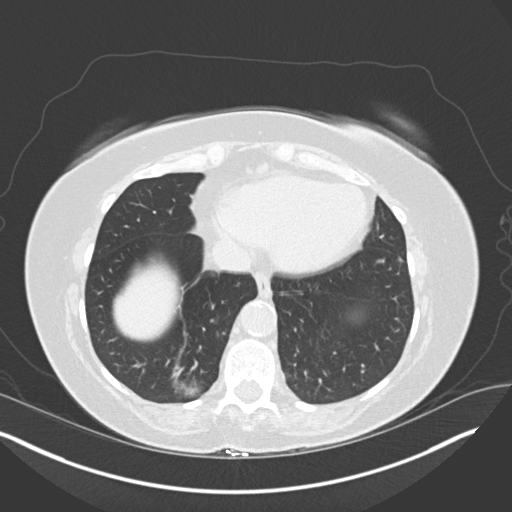
[im 45/147  lung]
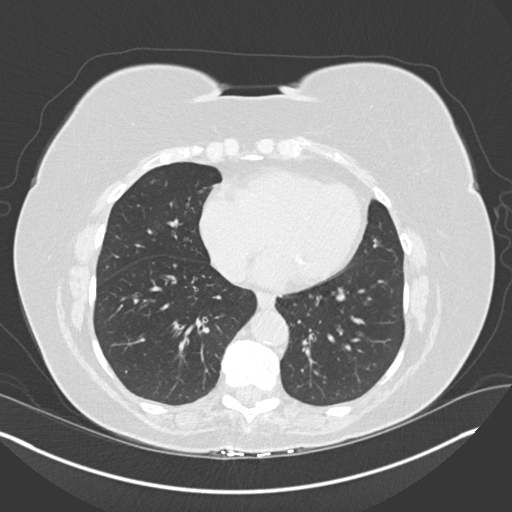
[im 57/147  mediastinal]
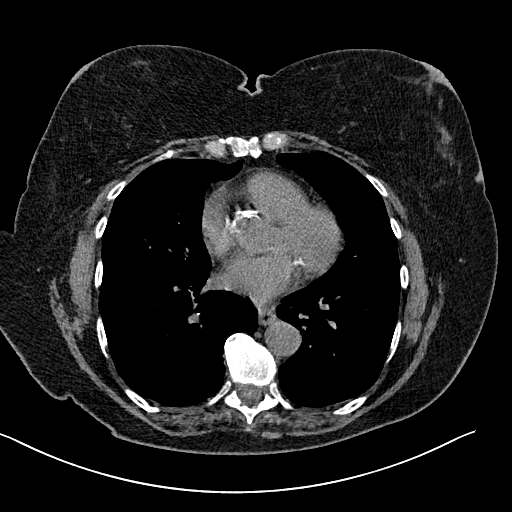
[im 57/147  lung]
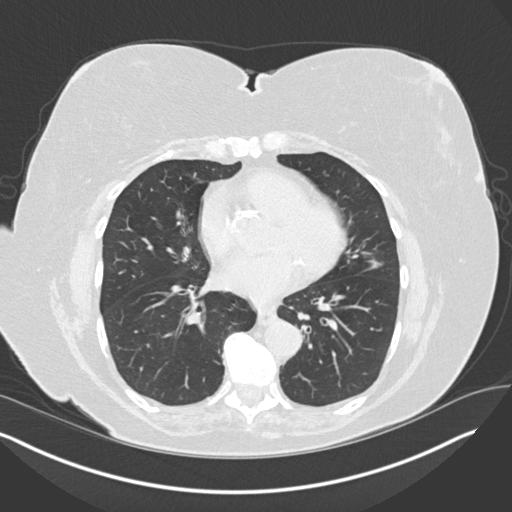
[im 68/147  lung]
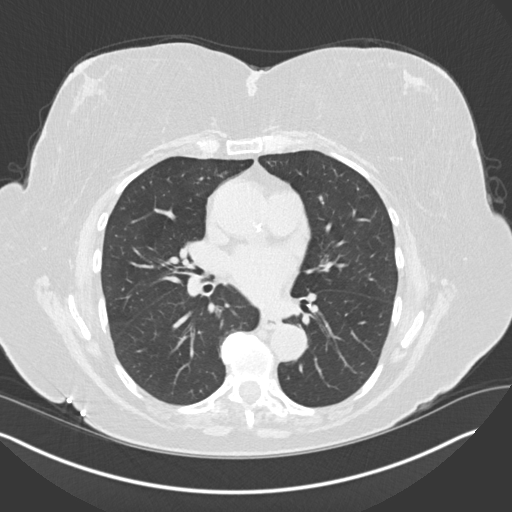
[im 79/147  lung]
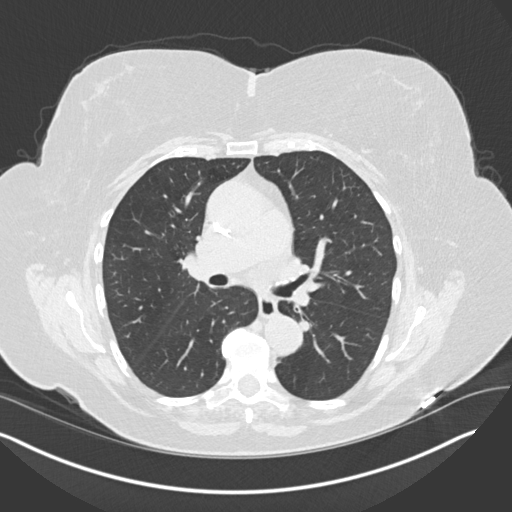
[im 90/147  lung]
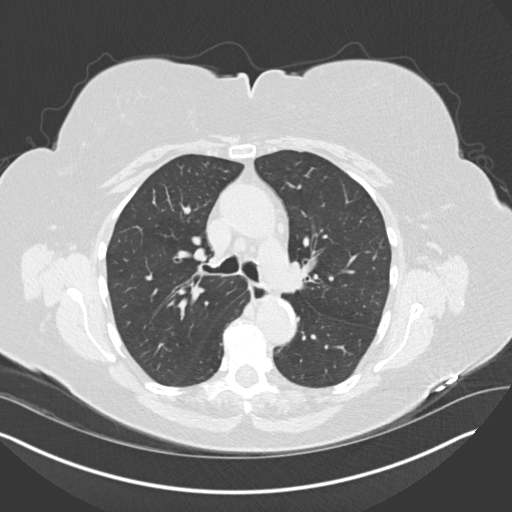
[im 102/147  mediastinal]
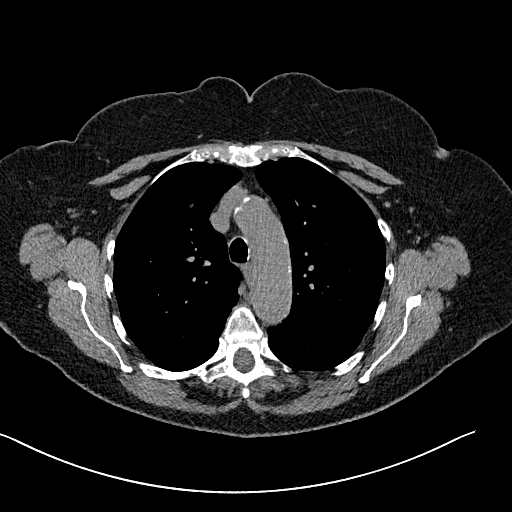
[im 102/147  lung]
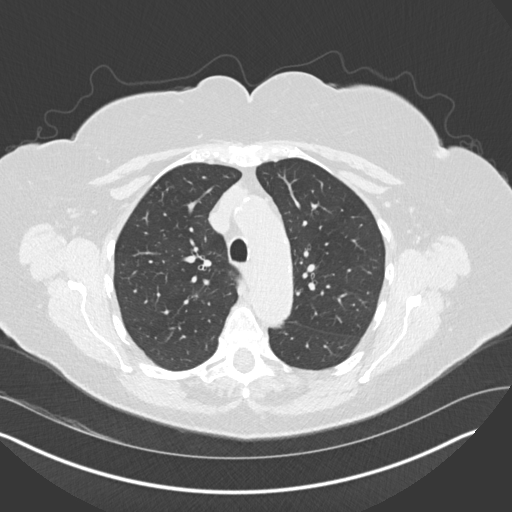
[im 113/147  lung]
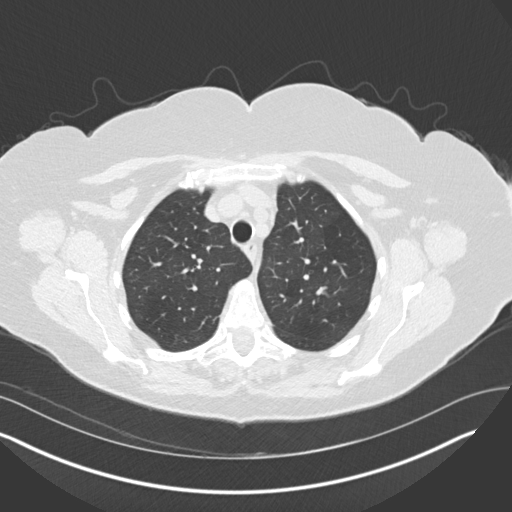
[im 124/147  lung]
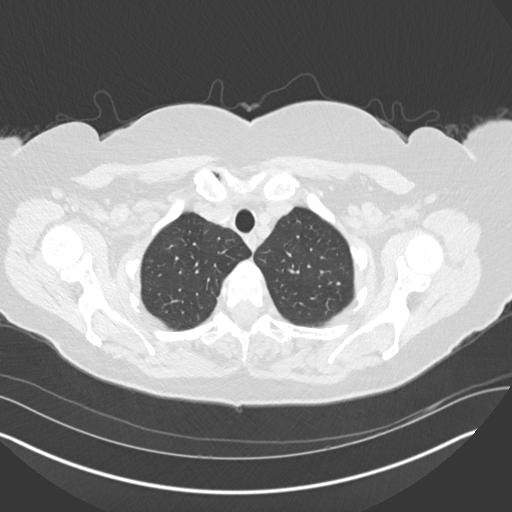
[im 135/147  lung]
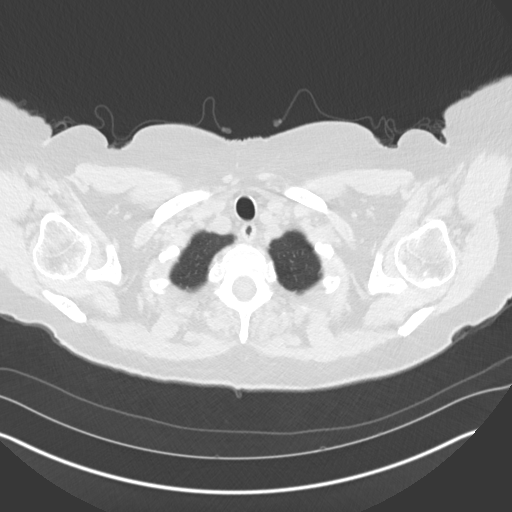

[Series 5: coronal · coronal · 0.56mm/px · 3 of 136 slices shown]
[im 28/136  lung]
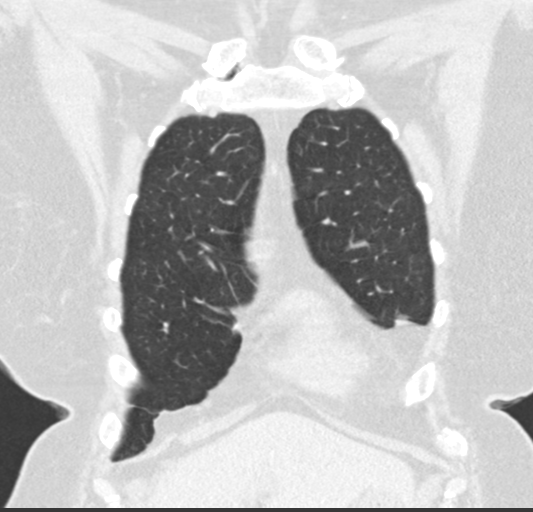
[im 55/136  lung]
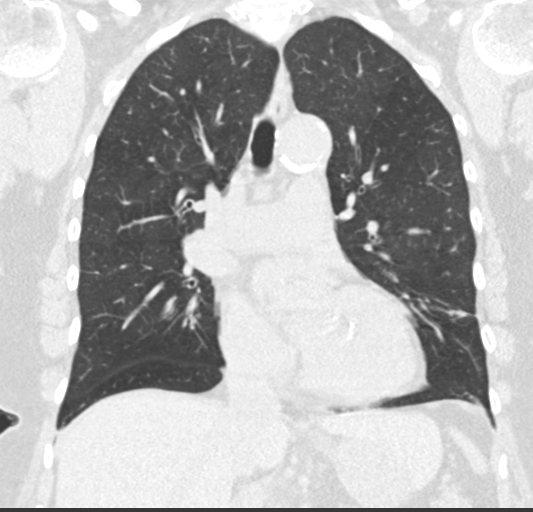
[im 82/136  lung]
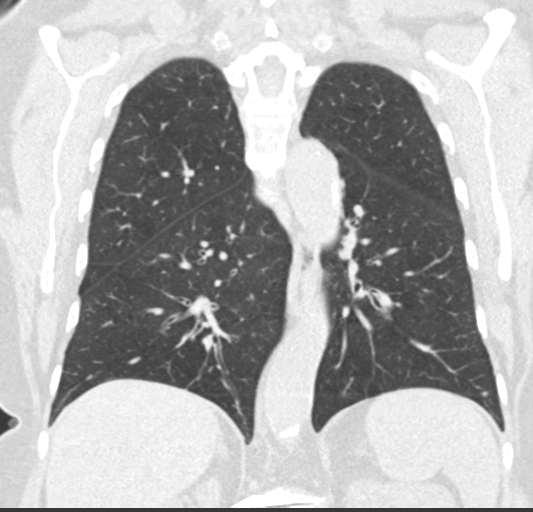

[15 of 36 positions shown; findings below may reference images not displayed]

FINDINGS: Cardiovascular: Atherosclerotic calcification of the aorta, aortic
valve and coronary arteries. Heart is at the upper limits of normal
in size. No pericardial effusion.

Mediastinum/Nodes: No pathologically enlarged mediastinal or
axillary lymph nodes. Hilar regions are difficult to evaluate
without IV contrast. Esophagus is grossly unremarkable.

Lungs/Pleura: Scattered pulmonary parenchymal scarring. Scattered
pulmonary nodules measure 4 mm or less in size, are unchanged and
are considered benign. No pleural fluid. Airway is unremarkable.

Upper Abdomen: Visualized portions of the liver, adrenal glands,
kidneys, spleen, pancreas and stomach are grossly unremarkable.

Musculoskeletal: Degenerative changes in the spine. No worrisome
lytic or sclerotic lesions.
IMPRESSION: 1. Small area of ground-glass in the posterior segment right upper
lobe, seen on 03/01/2020, is no longer visualized.
2. Scattered small pulmonary nodules measure 4 mm or less in size,
are unchanged and are considered benign.
3. Aortic atherosclerosis ([id number]). Coronary artery
calcification.

## 2023-07-15 DIAGNOSIS — Z1231 Encounter for screening mammogram for malignant neoplasm of breast: Secondary | ICD-10-CM | POA: Diagnosis not present

## 2023-07-23 ENCOUNTER — Other Ambulatory Visit: Payer: Self-pay | Admitting: Physician Assistant

## 2023-07-23 ENCOUNTER — Observation Stay (HOSPITAL_BASED_OUTPATIENT_CLINIC_OR_DEPARTMENT_OTHER)
Admission: EM | Admit: 2023-07-23 | Discharge: 2023-07-25 | Disposition: A | Discharge status: Home / Self Care | Payer: Medicare Other | Attending: Emergency Medicine | Admitting: Emergency Medicine

## 2023-07-23 ENCOUNTER — Ambulatory Visit
Admission: RE | Admit: 2023-07-23 | Discharge: 2023-07-23 | Disposition: A | Discharge status: Home / Self Care | Payer: Medicare Other | Source: Ambulatory Visit | Attending: Physician Assistant | Admitting: Physician Assistant

## 2023-07-23 ENCOUNTER — Other Ambulatory Visit: Payer: Self-pay

## 2023-07-23 ENCOUNTER — Encounter (HOSPITAL_BASED_OUTPATIENT_CLINIC_OR_DEPARTMENT_OTHER): Payer: Self-pay | Admitting: Emergency Medicine

## 2023-07-23 DIAGNOSIS — R7401 Elevation of levels of liver transaminase levels: Secondary | ICD-10-CM | POA: Insufficient documentation

## 2023-07-23 DIAGNOSIS — Z7901 Long term (current) use of anticoagulants: Secondary | ICD-10-CM | POA: Insufficient documentation

## 2023-07-23 DIAGNOSIS — K56699 Other intestinal obstruction unspecified as to partial versus complete obstruction: Secondary | ICD-10-CM | POA: Diagnosis not present

## 2023-07-23 DIAGNOSIS — D72829 Elevated white blood cell count, unspecified: Secondary | ICD-10-CM | POA: Insufficient documentation

## 2023-07-23 DIAGNOSIS — Z79899 Other long term (current) drug therapy: Secondary | ICD-10-CM | POA: Diagnosis not present

## 2023-07-23 DIAGNOSIS — I700 Atherosclerosis of aorta: Secondary | ICD-10-CM | POA: Diagnosis not present

## 2023-07-23 DIAGNOSIS — R109 Unspecified abdominal pain: Secondary | ICD-10-CM

## 2023-07-23 DIAGNOSIS — Z87891 Personal history of nicotine dependence: Secondary | ICD-10-CM | POA: Diagnosis not present

## 2023-07-23 DIAGNOSIS — K209 Esophagitis, unspecified without bleeding: Secondary | ICD-10-CM | POA: Diagnosis not present

## 2023-07-23 DIAGNOSIS — K56609 Unspecified intestinal obstruction, unspecified as to partial versus complete obstruction: Secondary | ICD-10-CM | POA: Diagnosis not present

## 2023-07-23 DIAGNOSIS — K5900 Constipation, unspecified: Secondary | ICD-10-CM | POA: Diagnosis present

## 2023-07-23 DIAGNOSIS — J9601 Acute respiratory failure with hypoxia: Secondary | ICD-10-CM | POA: Diagnosis present

## 2023-07-23 DIAGNOSIS — E785 Hyperlipidemia, unspecified: Secondary | ICD-10-CM | POA: Diagnosis not present

## 2023-07-23 DIAGNOSIS — E039 Hypothyroidism, unspecified: Secondary | ICD-10-CM | POA: Insufficient documentation

## 2023-07-23 DIAGNOSIS — R9431 Abnormal electrocardiogram [ECG] [EKG]: Secondary | ICD-10-CM | POA: Diagnosis not present

## 2023-07-23 DIAGNOSIS — R1110 Vomiting, unspecified: Secondary | ICD-10-CM | POA: Diagnosis not present

## 2023-07-23 LAB — COMPREHENSIVE METABOLIC PANEL
ALT: 25 U/L (ref 0–44)
AST: 32 U/L (ref 15–41)
Albumin: 4 g/dL (ref 3.5–5.0)
Alkaline Phosphatase: 69 U/L (ref 38–126)
Anion gap: 11 (ref 5–15)
BUN: 25 mg/dL — ABNORMAL HIGH (ref 8–23)
CO2: 30 mmol/L (ref 22–32)
Calcium: 9.4 mg/dL (ref 8.9–10.3)
Chloride: 93 mmol/L — ABNORMAL LOW (ref 98–111)
Creatinine, Ser: 0.93 mg/dL (ref 0.44–1.00)
GFR, Estimated: >60 mL/min (ref >60)
Glucose, Bld: 97 mg/dL (ref 70–99)
Potassium: 3.8 mmol/L (ref 3.5–5.1)
Sodium: 134 mmol/L — ABNORMAL LOW (ref 135–145)
Total Bilirubin: 4.5 mg/dL — ABNORMAL HIGH (ref 0.0–1.2)
Total Protein: 8.1 g/dL (ref 6.5–8.1)

## 2023-07-23 LAB — CBC WITH DIFFERENTIAL/PLATELET
Abs Immature Granulocytes: 0.05 10*3/uL (ref 0.00–0.07)
Basophils Absolute: 0 10*3/uL (ref 0.0–0.1)
Basophils Relative: 0 %
Eosinophils Absolute: 0 10*3/uL (ref 0.0–0.5)
Eosinophils Relative: 0 %
HCT: 48.5 % — ABNORMAL HIGH (ref 36.0–46.0)
Hemoglobin: 16.4 g/dL — ABNORMAL HIGH (ref 12.0–15.0)
Immature Granulocytes: 0 %
Lymphocytes Relative: 15 %
Lymphs Abs: 2.1 10*3/uL (ref 0.7–4.0)
MCH: 30.1 pg (ref 26.0–34.0)
MCHC: 33.8 g/dL (ref 30.0–36.0)
MCV: 89 fL (ref 80.0–100.0)
Monocytes Absolute: 1.6 10*3/uL — ABNORMAL HIGH (ref 0.1–1.0)
Monocytes Relative: 11 %
Neutro Abs: 10.4 10*3/uL — ABNORMAL HIGH (ref 1.7–7.7)
Neutrophils Relative %: 74 %
Platelets: 311 10*3/uL (ref 150–400)
RBC: 5.45 MIL/uL — ABNORMAL HIGH (ref 3.87–5.11)
RDW: 13.2 % (ref 11.5–15.5)
WBC: 14.2 10*3/uL — ABNORMAL HIGH (ref 4.0–10.5)
nRBC: 0 % (ref 0.0–0.2)

## 2023-07-23 MED ORDER — ONDANSETRON HCL 4 MG/2ML IJ SOLN
4.0000 mg | Freq: Four times a day (QID) | INTRAMUSCULAR | Status: DC | PRN
  Administered 2023-07-23: 4 mg via INTRAVENOUS
  Filled 2023-07-23: qty 2

## 2023-07-23 MED ORDER — LACTATED RINGERS IV SOLN: INTRAVENOUS | Status: AC

## 2023-07-23 MED ORDER — IOPAMIDOL (ISOVUE-300) INJECTION 61%
100.0000 mL | Freq: Once | INTRAVENOUS | Status: AC | PRN
  Administered 2023-07-23: 100 mL via INTRAVENOUS

## 2023-07-23 MED ORDER — LACTATED RINGERS IV BOLUS: 1000.0000 mL | Freq: Once | INTRAVENOUS | Status: DC

## 2023-07-23 MED ORDER — MORPHINE SULFATE (PF) 4 MG/ML IV SOLN
4.0000 mg | INTRAVENOUS | Status: DC | PRN
  Administered 2023-07-23: 4 mg via INTRAVENOUS
  Filled 2023-07-23: qty 1

## 2023-07-23 NOTE — Subjective & Objective (Addendum)
3-day history of constipation decreased p.o. intake now started to have vomiting and abdominal pain gradually getting worse.  Prior history of abdominal surgeries including hernia repair 10 years ago and 2 C-sections.  Was seen by primary care got a CT scan that showed possible small bowel obstruction she was sent to freestanding ER.  Patient started to somewhat improved able to tolerate clears.  Refuse NG tube placement

## 2023-07-23 NOTE — ED Notes (Signed)
Pt was able to stand and sit on the bed from the wheelchair without incident.

## 2023-07-23 NOTE — H&P (Signed)
Sara Bryan ZOX:[id number] DOB: [birth year] DOA: 07/23/2023     PCP: Tally Joe, MD     Patient arrived to ER on 07/23/23 at 1639 Referred by Attending Therisa Doyne, MD   Patient coming from:    home Lives alone,    Chief Complaint:  Chief Complaint  Patient presents with   Constipation   Small Bowel Obstruction    HPI: Sara Bryan is a 83 y.o. female with medical history significant of hypothyroidism HLD   Presented with    3-day history of constipation decreased p.o. intake now started to have vomiting and abdominal pain gradually getting worse.  Prior history of abdominal surgeries including hernia repair 10 years ago and 2 C-sections.  Was seen by primary care got a CT scan that showed possible small bowel obstruction she was sent to freestanding ER.  Patient started to somewhat improved able to tolerate clears.  Refuse NG tube placement   She still works in [employer]  Denies significant ETOH intake  Does not smoke  No results found for: "SARSCOV2NAA"      Regarding pertinent Chronic problems:     Hyperlipidemia -  on statins Crestor   Hypothyroidism:   on synthroid    While in ER: Clinical Course as of 07/23/23 2349  Thu Jul 23, 2023  1907 [phone] [facility] MRN [id number] 62 YOF with SBO. Medcine admit, just alerting gen surg Thanks! [phone] [CC]    Clinical Course User Index [CC] Glyn Ade, MD     Lab Orders         Comprehensive metabolic panel         CBC with Differential       CTabd/pelvis - Small-bowel obstruction with transition identified within an area of residual/recurrent wall laxity. No complicating ischemia identified. \  Possible distal esophagitis. Mild wall thickening with possible periesophageal edema.   Following Medications were ordered in ER: Medications  morphine (PF) 4 MG/ML injection 4 mg (4 mg Intravenous Given 07/23/23 1839)  ondansetron (ZOFRAN) injection 4 mg (4  mg Intravenous Given 07/23/23 1839)  lactated ringers infusion ( Intravenous New Bag/Given 07/23/23 1837)    _______________________________________________________ ER Provider Called:   General surgery They Recommend admit to medicine   General Surgery is aware      Pt in ER has desats down to 80% after getting IV morphine  ED Triage Vitals  Encounter Vitals Group     BP 07/23/23 1650 (!) 164/68     Systolic BP Percentile --      Diastolic BP Percentile --      Pulse Rate 07/23/23 1650 89     Resp 07/23/23 1650 16     Temp 07/23/23 1650 98.2 F (36.8 C)     Temp Source 07/23/23 1650 Oral     SpO2 07/23/23 1650 94 %     Weight 07/23/23 1650 192 lb (87.1 kg)     Height 07/23/23 1650 5\' 4"  (1.626 m)     Head Circumference --      Peak Flow --      Pain Score 07/23/23 1650 6     Pain Loc --      Pain Education --      Exclude from Growth Chart --   WUXL(24)@     _________________________________________ Significant initial  Findings: Abnormal Labs Reviewed  COMPREHENSIVE METABOLIC PANEL - Abnormal; Notable for the following components:      Result Value   Sodium 134 (*)  Chloride 93 (*)    BUN 25 (*)    Total Bilirubin 4.5 (*)    All other components within normal limits  CBC WITH DIFFERENTIAL/PLATELET - Abnormal; Notable for the following components:   WBC 14.2 (*)    RBC 5.45 (*)    Hemoglobin 16.4 (*)    HCT 48.5 (*)    Neutro Abs 10.4 (*)    Monocytes Absolute 1.6 (*)    All other components within normal limits   ECG: Ordered Personally reviewed and interpreted by me showing: HR : 77 Rhythm: Sinus rhythm Low voltage, precordial leads QTC 415  __________  The recent clinical data is shown below. Vitals:   07/23/23 1900 07/23/23 1955 07/23/23 2000 07/23/23 2205  BP: (!) 130/51  (!) 129/54 (!) 136/44  Pulse: 79  77 77  Resp:   15 19  Temp:    98 F (36.7 C)  TempSrc:      SpO2: (!) 86% 98% 100% 97%  Weight:      Height:      WBC     Component  Value Date/Time   WBC 14.2 (H) 07/23/2023 1704   LYMPHSABS 2.1 07/23/2023 1704   LYMPHSABS 2.5 08/04/2016 1459   MONOABS 1.6 (H) 07/23/2023 1704   EOSABS 0.0 07/23/2023 1704   EOSABS 0.3 08/04/2016 1459   BASOSABS 0.0 07/23/2023 1704   BASOSABS 0.1 08/04/2016 1459       Results for orders placed or performed during the hospital encounter of 04/14/11  Surgical pcr screen     Status: None   Collection Time: 04/14/11  1:21 PM   Specimen: Nasal Swab  Result Value Ref Range Status   MRSA, PCR NEGATIVE NEGATIVE Final   Staphylococcus aureus NEGATIVE NEGATIVE Final    Comment:        The Xpert SA Assay (FDA approved for NASAL specimens only), is one component of a comprehensive surveillance program.  It is not intended to diagnose infection nor to guide or monitor treatment.  __________________________________________________________ Recent Labs  Lab 07/23/23 1704  NA 134*  K 3.8  CO2 30  GLUCOSE 97  BUN 25*  CREATININE 0.93  CALCIUM 9.4    Cr   stable,    Lab Results  Component Value Date   CREATININE 0.93 07/23/2023   CREATININE 0.79 08/12/2022   CREATININE 0.73 08/06/2021    Recent Labs  Lab 07/23/23 1704  AST 32  ALT 25  ALKPHOS 69  BILITOT 4.5*  PROT 8.1  ALBUMIN 4.0   Lab Results  Component Value Date   CALCIUM 9.4 07/23/2023    Plt: Lab Results  Component Value Date   PLT 311 07/23/2023    Recent Labs  Lab 07/23/23 1704  WBC 14.2*  NEUTROABS 10.4*  HGB 16.4*  HCT 48.5*  MCV 89.0  PLT 311    HG/HCT stable,      Component Value Date/Time   HGB 16.4 (H) 07/23/2023 1704   HGB 15.2 (H) 08/12/2022 0848   HGB 16.0 (H) 08/04/2016 1459   HCT 48.5 (H) 07/23/2023 1704   HCT 46.2 08/04/2016 1459   MCV 89.0 07/23/2023 1704   MCV 89 08/04/2016 1459    _______________________________________________ Hospitalist was called for admission for   SBO      The following Work up has been ordered so far:  Orders Placed This Encounter  Procedures    Comprehensive metabolic panel   CBC with Differential   Consult to hospitalist   EKG 12-Lead  EKG   EKG   Place in observation (patient's expected length of stay will be less than 2 midnights)     OTHER Significant initial  Findings:  labs showing:     DM  labs:  HbA1C: No results for input(s): "HGBA1C" in the last 8760 hours.     CBG (last 3)  No results for input(s): "GLUCAP" in the last 72 hours.        Cultures: No results found for: "SDES", "SPECREQUEST", "CULT", "REPTSTATUS"   Radiological Exams on Admission: CT ABDOMEN PELVIS W CONTRAST Result Date: 07/23/2023 CLINICAL DATA:  Lower abdominal pain for 2 days. Hernia surgery in same area. History of melanoma. Cholecystectomy. EXAM: CT ABDOMEN AND PELVIS WITH CONTRAST TECHNIQUE: Multidetector CT imaging of the abdomen and pelvis was performed using the standard protocol following bolus administration of intravenous contrast. RADIATION DOSE REDUCTION: This exam was performed according to the departmental dose-optimization program which includes automated exposure control, adjustment of the mA and/or kV according to patient size and/or use of iterative reconstruction technique. CONTRAST:  ISOVUE-300 IOPAMIDOL (ISOVUE-300) INJECTION 61% COMPARISON:  10/05/2012 CTA.  PET of 05/12/2016 FINDINGS: Lower chest: Motion degradation in the lower chest. A left lower lobe 3 mm nodule was similar on 03/04/2021 chest CT and is considered benign. Normal heart size without pericardial or pleural effusion. Mild distal esophageal wall thickening with possible periesophageal edema on 11/02. Hepatobiliary: Normal liver. Cholecystectomy, without biliary ductal dilatation. Pancreas: Fatty replaced pancreas. No duct dilatation or acute inflammation. Spleen: Normal in size, without focal abnormality. Adrenals/Urinary Tract: Normal adrenal glands. Normal kidneys, without hydronephrosis. Normal urinary bladder. Stomach/Bowel: Normal stomach, without  wall thickening. Extensive colonic diverticulosis. Normal terminal ileum. Mid small bowel loops are dilated and fluid-filled including up to 3.2 cm on 45/2. At the level of the mid to distal ileum, a transition point is identified within the mid right portion of the ventral abdominal wall hernia repair site. There is residual laxity in this area containinga both small-bowel (62/2) and just superiorly, nonobstructive transverse colon on 60/2. No complicating ischemia. Vascular/Lymphatic: Aortic atherosclerosis. No abdominopelvic adenopathy. Reproductive: Small uterine calcification could represent a dystrophic fibroid or be vascular. No adnexal mass. Other: No significant free fluid. Mild pelvic floor laxity. No free intraperitoneal air. Separate more cephalad small ventral abdominal wall laxity site containing fat on 35/2. Musculoskeletal: Lumbosacral spondylosis. Lucent subcentimeter L3 lesion is similar to 2014 and considered benign. Mild convex left lumbar spine curvature. IMPRESSION: 1. Status post ventral abdominal wall hernia repair. Small-bowel obstruction with transition identified within an area of residual/recurrent wall laxity. No complicating ischemia identified. 2. Possible distal esophagitis. Mild wall thickening with possible periesophageal edema. 3.  Aortic Atherosclerosis (ICD10-I70.0). These results will be called to the ordering clinician or representative by the Radiologist Assistant, and communication documented in the PACS or [employer]. Electronically Signed   By: Jeronimo Greaves M.D.   On: 07/23/2023 14:16   _______________________________________________________________________________________________________ Latest  Blood pressure (!) 136/44, pulse 77, temperature 98 F (36.7 C), resp. rate 19, height 5\' 4"  (1.626 m), weight 87.1 kg, SpO2 97%.   Vitals  labs and radiology finding personally reviewed  Review of Systems:    Pertinent positives include:  abdominal pain, nausea,  vomiting,   Constitutional:  No weight loss, night sweats, Fevers, chills, fatigue, weight loss  HEENT:  No headaches, Difficulty swallowing,Tooth/dental problems,Sore throat,  No sneezing, itching, ear ache, nasal congestion, post nasal drip,  Cardio-vascular:  No chest pain, Orthopnea, PND, anasarca, dizziness, palpitations.no  Bilateral lower extremity swelling  GI:  No heartburn, indigestion, diarrhea, change in bowel habits, loss of appetite, melena, blood in stool, hematemesis Resp:  no shortness of breath at rest. No dyspnea on exertion, No excess mucus, no productive cough, No non-productive cough, No coughing up of blood.No change in color of mucus.No wheezing. Skin:  no rash or lesions. No jaundice GU:  no dysuria, change in color of urine, no urgency or frequency. No straining to urinate.  No flank pain.  Musculoskeletal:  No joint pain or no joint swelling. No decreased range of motion. No back pain.  Psych:  No change in mood or affect. No depression or anxiety. No memory loss.  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   Hernia    History of hiatal hernia    Hyperlipidemia    Supraventricular tachycardia (HCC) 2007   once- cardiologist- dr Elease Hashimoto; yearly   Varicose veins      Past Surgical History:  Procedure Laterality Date   CESAREAN SECTION     CHOLECYSTECTOMY N/A 03/19/2016   Procedure: LAPAROSCOPIC CHOLECYSTECTOMY WITH INTRAOPERATIVE CHOLANGIOGRAM;   Surgeon: Claud Kelp, MD;  Location: [facility] OR;  Service: General;  Laterality: N/A;   HERNIA REPAIR  1975   abd wall   HERNIA REPAIR     HERNIA REPAIR     HERNIA REPAIR     MELANOMA EXCISION     from back in 2007    Social History:  Ambulatory   independently     reports that she quit smoking about 32 years ago. Her smoking use included cigarettes. She started smoking about 63 years ago. She has a 15.8 pack-year smoking history. She has never used smokeless tobacco. She reports that she does not drink alcohol and does not use drugs.   Family History:  Family History  Problem Relation Age of Onset   Cancer Mother        Breast   Arthritis Father        RA   Heart disease Father        cardiomegaly   ______________________________________________________________________________________________ Allergies: Allergies  Allergen Reactions   Levaquin [Levofloxacin] Other (See Comments)    Diarrhea/Muscle weakness/inability to lift arms/limbs   Aspirin Nausea And Vomiting and Nausea Only    Enteric is ok   Atorvastatin Other (See Comments)    Other reaction(s): myalgias   Codeine Nausea Only     Prior to Admission medications   Medication Sig Start Date End Date Taking? Authorizing Provider  acetaminophen (TYLENOL) 500 MG tablet Take 500 mg by mouth every 6 (six) hours as needed for mild pain.   Yes [provider]  aspirin EC 81 MG tablet Take 81 mg by mouth daily.   Yes [provider]  CALCIUM PO Take 1,000 mg by mouth daily.   Yes [provider]  cetirizine (ZYRTEC) 10 MG tablet Take 10 mg by mouth daily as needed for allergies.   Yes [provider]  CHOLECALCIFEROL PO Take 6,000 Units by mouth daily.   Yes [provider]  Coenzyme Q10 (CO Q-10 PO) Take 100 mg by mouth daily.   Yes [provider]  fish oil-omega-3 fatty acids 1000 MG capsule Take 1 g by mouth daily.   Yes [provider]  ibuprofen  (ADVIL,MOTRIN) 200 MG tablet Take 200 mg by mouth every 6 (six) hours as needed for moderate pain.   Yes [provider]  levothyroxine (SYNTHROID) 50 MCG tablet Take 50 mcg by mouth daily.   Yes [provider]  Multiple Vitamins-Minerals (MULTIVITAMINS THER. W/MINERALS) TABS Take 1 tablet by mouth daily.   Yes [provider]  Probiotic Product (PROBIOTIC DAILY PO) Take 1 tablet by mouth daily.    Yes [provider]  vitamin C (ASCORBIC ACID) 500 MG tablet Take 500 mg by mouth daily.   Yes [provider]  b complex vitamins tablet Take 1 tablet by mouth daily.    [provider]    ___________________________________________________________________________________________________ Physical Exam:    07/23/2023   10:05 PM 07/23/2023    8:00 PM 07/23/2023    7:00 PM  Vitals with BMI  Systolic 136 129 562  Diastolic 44 54 51  Pulse 77 77 79     1. General:  in No  Acute distress  well -appearing 2. Psychological: Alert and   Oriented 3. Head/ENT:    Dry Mucous Membranes                          Head Non traumatic, neck supple                         Poor Dentition 4. SKIN:  decreased Skin turgor,  Skin clean Dry and intact no rash     5. Heart: Regular rate and rhythm no  Murmur, no Rub or gallop 6. Lungs:  , no wheezes or crackles   7. Abdomen: Soft, mildly-tender, Non distended   obese  bowel sounds present 8. Lower extremities: no clubbing, cyanosis, no  edema 9. Neurologically Grossly intact, moving all 4 extremities equally   10. MSK: Normal range of motion    Chart has been reviewed  ______________________________________________________________________________________________  Assessment/Plan 83 y.o. female with medical history significant of hypothyroidism HLD    Admitted for   SBO  and transient hypoxia after IV narcotics    Present on Admission:  SBO (small bowel obstruction) (HCC)  Hyperlipidemia  Acute  respiratory failure with hypoxia (HCC)  Hypothyroidism    Hyperlipidemia Chronic stable restart Crestor once able to tolerate  SBO (small bowel obstruction) (HCC)  Likely cause prior surgery  - admit for conservative management  - Patient refused NG tube and currently tolerating clears   - KUB in AM - appreciate General surgery consult.\  Acute respiratory failure with hypoxia (HCC) In the setting of opioid use.  Patient currently on oxygen at baseline does not require any oxygen.  Will see if able to wean down avoid IV morphine as this was the culprit.  Patient currently alert and oriented continue to monitor obtain chest x-ray for completion  Hypothyroidism - Check TSH continue home medications Synthroid at 50 mcg po q day   Other plan as per orders.  DVT prophylaxis:  SCD  Code Status:    Code Status: Not on file FULL CODE as per patient  I had personally discussed CODE STATUS with patient  ACP  none   Family Communication:   Family not at  Bedside  Diet clear   Disposition Plan:      To home once workup is complete and patient is stable   Following barriers for discharge:                                                       Electrolytes corrected                               Anemia corrected h/H stable                             Pain controlled with PO medications                               Afebrile, white count improving able to transition to PO antibiotics                             Will need to be able to tolerate PO                            Will likely need home health, home O2, set up                           Will need consultants to evaluate patient prior to discharge       Consult Orders  (From admission, onward)           Start     Ordered   07/23/23 1731  Consult to hospitalist  Called [facility] @18 :00 for consult to Hospitalist.  Spoke with Vinetta Bergamo  Once       Provider:  (Not yet assigned)  Question Answer Comment  Place call to: [facility]   Reason for Consult Admit      07/23/23 1730                               Consults called:     Treatment Team:  Ccs, Md, MD  Admission status:  ED Disposition     ED Disposition  Admit   Condition  --   Comment  Hospital Area[facility] [[id number]]  Level of Care: Med-Surg [16]  Interfacility transfer: Yes  May place patient in observation at [facility] or [facility] if equivalent level of care is available:: Yes  Covid Evaluation: Asymptomatic - no recent exposure (last 10 days) testing not required  Diagnosis: SBO (small bowel obstruction) [facility]) [[id number]]  Admitting Physician: Therisa Doyne [[id number]]  Attending Physician: Therisa Doyne [[id number]]           Obs     Level of care   medical floor    Therisa Doyne 07/24/2023, 12:49 AM    [facility]     after 2 AM please page floor coverage PA If 7AM-7PM, please contact the day team taking care of the patient using [email or web address]

## 2023-07-23 NOTE — H&P (Incomplete)
Sara Bryan QIH:[id number] DOB: [birth year] DOA: 07/23/2023     PCP: Tally Joe, MD     Patient arrived to ER on 07/23/23 at 1639 Referred by Attending Therisa Doyne, MD   Patient coming from:    home Lives alone,    Chief Complaint:  Chief Complaint  Patient presents with  . Constipation  . Small Bowel Obstruction    HPI: Sara Bryan is a 83 y.o. female with medical history significant of hypothyroidism HLD   Presented with    3-day history of constipation decreased p.o. intake now started to have vomiting and abdominal pain gradually getting worse.  Prior history of abdominal surgeries including hernia repair 10 years ago and 2 C-sections.  Was seen by primary care got a CT scan that showed possible small bowel obstruction she was sent to freestanding ER.  Patient started to somewhat improved able to tolerate clears.  Refuse NG tube placement   She still works in [employer]  Denies significant ETOH intake  Does not smoke  No results found for: "SARSCOV2NAA"      Regarding pertinent Chronic problems:     Hyperlipidemia -  on statins Crestor   Hypothyroidism:   on synthroid    While in ER: Clinical Course as of 07/23/23 2349  Thu Jul 23, 2023  1907 [phone] [facility] MRN [id number] 9 YOF with SBO. Medcine admit, just alerting gen surg Thanks! [phone] [CC]    Clinical Course User Index [CC] Glyn Ade, MD     Lab Orders         Comprehensive metabolic panel         CBC with Differential       CTabd/pelvis - Small-bowel obstruction with transition identified within an area of residual/recurrent wall laxity. No complicating ischemia identified. \  Possible distal esophagitis. Mild wall thickening with possible periesophageal edema.   Following Medications were ordered in ER: Medications  morphine (PF) 4 MG/ML injection 4 mg (4 mg Intravenous Given 07/23/23 1839)  ondansetron (ZOFRAN) injection 4 mg  (4 mg Intravenous Given 07/23/23 1839)  lactated ringers infusion ( Intravenous New Bag/Given 07/23/23 1837)    _______________________________________________________ ER Provider Called:   General surgery They Recommend admit to medicine   General Surgery is aware      Pt in ER has desats down to 80% after getting IV morphine  ED Triage Vitals  Encounter Vitals Group     BP 07/23/23 1650 (!) 164/68     Systolic BP Percentile --      Diastolic BP Percentile --      Pulse Rate 07/23/23 1650 89     Resp 07/23/23 1650 16     Temp 07/23/23 1650 98.2 F (36.8 C)     Temp Source 07/23/23 1650 Oral     SpO2 07/23/23 1650 94 %     Weight 07/23/23 1650 192 lb (87.1 kg)     Height 07/23/23 1650 5\' 4"  (1.626 m)     Head Circumference --      Peak Flow --      Pain Score 07/23/23 1650 6     Pain Loc --      Pain Education --      Exclude from Growth Chart --   TFTD(32)@     _________________________________________ Significant initial  Findings: Abnormal Labs Reviewed  COMPREHENSIVE METABOLIC PANEL - Abnormal; Notable for the following components:      Result Value   Sodium 134 (*)  Chloride 93 (*)    BUN 25 (*)    Total Bilirubin 4.5 (*)    All other components within normal limits  CBC WITH DIFFERENTIAL/PLATELET - Abnormal; Notable for the following components:   WBC 14.2 (*)    RBC 5.45 (*)    Hemoglobin 16.4 (*)    HCT 48.5 (*)    Neutro Abs 10.4 (*)    Monocytes Absolute 1.6 (*)    All other components within normal limits   ECG: Ordered Personally reviewed and interpreted by me showing: HR : 77 Rhythm: Sinus rhythm Low voltage, precordial leads QTC 415  __________  The recent clinical data is shown below. Vitals:   07/23/23 1900 07/23/23 1955 07/23/23 2000 07/23/23 2205  BP: (!) 130/51  (!) 129/54 (!) 136/44  Pulse: 79  77 77  Resp:   15 19  Temp:    98 F (36.7 C)  TempSrc:      SpO2: (!) 86% 98% 100% 97%  Weight:      Height:      WBC     Component  Value Date/Time   WBC 14.2 (H) 07/23/2023 1704   LYMPHSABS 2.1 07/23/2023 1704   LYMPHSABS 2.5 08/04/2016 1459   MONOABS 1.6 (H) 07/23/2023 1704   EOSABS 0.0 07/23/2023 1704   EOSABS 0.3 08/04/2016 1459   BASOSABS 0.0 07/23/2023 1704   BASOSABS 0.1 08/04/2016 1459       Results for orders placed or performed during the hospital encounter of 04/14/11  Surgical pcr screen     Status: None   Collection Time: 04/14/11  1:21 PM   Specimen: Nasal Swab  Result Value Ref Range Status   MRSA, PCR NEGATIVE NEGATIVE Final   Staphylococcus aureus NEGATIVE NEGATIVE Final    Comment:        The Xpert SA Assay (FDA approved for NASAL specimens only), is one component of a comprehensive surveillance program.  It is not intended to diagnose infection nor to guide or monitor treatment.  __________________________________________________________ Recent Labs  Lab 07/23/23 1704  NA 134*  K 3.8  CO2 30  GLUCOSE 97  BUN 25*  CREATININE 0.93  CALCIUM 9.4    Cr   stable,    Lab Results  Component Value Date   CREATININE 0.93 07/23/2023   CREATININE 0.79 08/12/2022   CREATININE 0.73 08/06/2021    Recent Labs  Lab 07/23/23 1704  AST 32  ALT 25  ALKPHOS 69  BILITOT 4.5*  PROT 8.1  ALBUMIN 4.0   Lab Results  Component Value Date   CALCIUM 9.4 07/23/2023    Plt: Lab Results  Component Value Date   PLT 311 07/23/2023    Recent Labs  Lab 07/23/23 1704  WBC 14.2*  NEUTROABS 10.4*  HGB 16.4*  HCT 48.5*  MCV 89.0  PLT 311    HG/HCT stable,      Component Value Date/Time   HGB 16.4 (H) 07/23/2023 1704   HGB 15.2 (H) 08/12/2022 0848   HGB 16.0 (H) 08/04/2016 1459   HCT 48.5 (H) 07/23/2023 1704   HCT 46.2 08/04/2016 1459   MCV 89.0 07/23/2023 1704   MCV 89 08/04/2016 1459    _______________________________________________ Hospitalist was called for admission for   SBO      The following Work up has been ordered so far:  Orders Placed This Encounter  Procedures   . Comprehensive metabolic panel  . CBC with Differential  . Consult to hospitalist  . EKG 12-Lead  .  EKG  . EKG  . Place in observation (patient's expected length of stay will be less than 2 midnights)     OTHER Significant initial  Findings:  labs showing:     DM  labs:  HbA1C: No results for input(s): "HGBA1C" in the last 8760 hours.     CBG (last 3)  No results for input(s): "GLUCAP" in the last 72 hours.        Cultures: No results found for: "SDES", "SPECREQUEST", "CULT", "REPTSTATUS"   Radiological Exams on Admission: CT ABDOMEN PELVIS W CONTRAST Result Date: 07/23/2023 CLINICAL DATA:  Lower abdominal pain for 2 days. Hernia surgery in same area. History of melanoma. Cholecystectomy. EXAM: CT ABDOMEN AND PELVIS WITH CONTRAST TECHNIQUE: Multidetector CT imaging of the abdomen and pelvis was performed using the standard protocol following bolus administration of intravenous contrast. RADIATION DOSE REDUCTION: This exam was performed according to the departmental dose-optimization program which includes automated exposure control, adjustment of the mA and/or kV according to patient size and/or use of iterative reconstruction technique. CONTRAST:  ISOVUE-300 IOPAMIDOL (ISOVUE-300) INJECTION 61% COMPARISON:  10/05/2012 CTA.  PET of 05/12/2016 FINDINGS: Lower chest: Motion degradation in the lower chest. A left lower lobe 3 mm nodule was similar on 03/04/2021 chest CT and is considered benign. Normal heart size without pericardial or pleural effusion. Mild distal esophageal wall thickening with possible periesophageal edema on 11/02. Hepatobiliary: Normal liver. Cholecystectomy, without biliary ductal dilatation. Pancreas: Fatty replaced pancreas. No duct dilatation or acute inflammation. Spleen: Normal in size, without focal abnormality. Adrenals/Urinary Tract: Normal adrenal glands. Normal kidneys, without hydronephrosis. Normal urinary bladder. Stomach/Bowel: Normal stomach,  without wall thickening. Extensive colonic diverticulosis. Normal terminal ileum. Mid small bowel loops are dilated and fluid-filled including up to 3.2 cm on 45/2. At the level of the mid to distal ileum, a transition point is identified within the mid right portion of the ventral abdominal wall hernia repair site. There is residual laxity in this area containinga both small-bowel (62/2) and just superiorly, nonobstructive transverse colon on 60/2. No complicating ischemia. Vascular/Lymphatic: Aortic atherosclerosis. No abdominopelvic adenopathy. Reproductive: Small uterine calcification could represent a dystrophic fibroid or be vascular. No adnexal mass. Other: No significant free fluid. Mild pelvic floor laxity. No free intraperitoneal air. Separate more cephalad small ventral abdominal wall laxity site containing fat on 35/2. Musculoskeletal: Lumbosacral spondylosis. Lucent subcentimeter L3 lesion is similar to 2014 and considered benign. Mild convex left lumbar spine curvature. IMPRESSION: 1. Status post ventral abdominal wall hernia repair. Small-bowel obstruction with transition identified within an area of residual/recurrent wall laxity. No complicating ischemia identified. 2. Possible distal esophagitis. Mild wall thickening with possible periesophageal edema. 3.  Aortic Atherosclerosis (ICD10-I70.0). These results will be called to the ordering clinician or representative by the Radiologist Assistant, and communication documented in the PACS or [employer]. Electronically Signed   By: Jeronimo Greaves M.D.   On: 07/23/2023 14:16   _______________________________________________________________________________________________________ Latest  Blood pressure (!) 136/44, pulse 77, temperature 98 F (36.7 C), resp. rate 19, height 5\' 4"  (1.626 m), weight 87.1 kg, SpO2 97%.   Vitals  labs and radiology finding personally reviewed  Review of Systems:    Pertinent positives include:  abdominal pain,  nausea, vomiting,   Constitutional:  No weight loss, night sweats, Fevers, chills, fatigue, weight loss  HEENT:  No headaches, Difficulty swallowing,Tooth/dental problems,Sore throat,  No sneezing, itching, ear ache, nasal congestion, post nasal drip,  Cardio-vascular:  No chest pain, Orthopnea, PND, anasarca, dizziness, palpitations.no  Bilateral lower extremity swelling  GI:  No heartburn, indigestion, diarrhea, change in bowel habits, loss of appetite, melena, blood in stool, hematemesis Resp:  no shortness of breath at rest. No dyspnea on exertion, No excess mucus, no productive cough, No non-productive cough, No coughing up of blood.No change in color of mucus.No wheezing. Skin:  no rash or lesions. No jaundice GU:  no dysuria, change in color of urine, no urgency or frequency. No straining to urinate.  No flank pain.  Musculoskeletal:  No joint pain or no joint swelling. No decreased range of motion. No back pain.  Psych:  No change in mood or affect. No depression or anxiety. No memory loss.  Neuro: no localizing neurological complaints, no tingling, no weakness, no double vision, no gait abnormality, no slurred speech, no confusion  All systems reviewed and apart from HOPI all are negative _______________________________________________________________________________________________ Past Medical History:   Past Medical History:  Diagnosis Date  . Arthritis   . Cancer (HCC)    Melonoma  . 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 Complication of anesthesia    was aware of c section going on.  . Hernia   . History of hiatal hernia   . Hyperlipidemia   . Supraventricular tachycardia (HCC) 2007   once- cardiologist- dr Elease Hashimoto; yearly  . Varicose veins      Past Surgical History:  Procedure Laterality Date  . CESAREAN SECTION    . CHOLECYSTECTOMY N/A 03/19/2016   Procedure: LAPAROSCOPIC CHOLECYSTECTOMY WITH INTRAOPERATIVE  CHOLANGIOGRAM;  Surgeon: Claud Kelp, MD;  Location: [facility] OR;  Service: General;  Laterality: N/A;  . HERNIA REPAIR  1975   abd wall  . HERNIA REPAIR    . HERNIA REPAIR    . HERNIA REPAIR    . MELANOMA EXCISION     from back in 2007    Social History:  Ambulatory   independently     reports that she quit smoking about 32 years ago. Her smoking use included cigarettes. She started smoking about 63 years ago. She has a 15.8 pack-year smoking history. She has never used smokeless tobacco. She reports that she does not drink alcohol and does not use drugs.   Family History:  Family History  Problem Relation Age of Onset  . Cancer Mother        Breast  . Arthritis Father        RA  . Heart disease Father        cardiomegaly   ______________________________________________________________________________________________ Allergies: Allergies  Allergen Reactions  . Levaquin [Levofloxacin] Other (See Comments)    Diarrhea/Muscle weakness/inability to lift arms/limbs  . Aspirin Nausea And Vomiting and Nausea Only    Enteric is ok  . Atorvastatin Other (See Comments)    Other reaction(s): myalgias  . Codeine Nausea Only     Prior to Admission medications   Medication Sig Start Date End Date Taking? Authorizing Provider  acetaminophen (TYLENOL) 500 MG tablet Take 500 mg by mouth every 6 (six) hours as needed for mild pain.   Yes [provider]  aspirin EC 81 MG tablet Take 81 mg by mouth daily.   Yes [provider]  CALCIUM PO Take 1,000 mg by mouth daily.   Yes [provider]  cetirizine (ZYRTEC) 10 MG tablet Take 10 mg by mouth daily as needed for allergies.   Yes [provider]  CHOLECALCIFEROL PO Take 6,000 Units by mouth daily.   Yes [provider]  Coenzyme Q10 (CO Q-10 PO) Take 100 mg by mouth daily.   Yes [provider]  fish oil-omega-3 fatty acids 1000 MG capsule Take 1 g by mouth daily.   Yes [provider]  ibuprofen (ADVIL,MOTRIN) 200 MG tablet Take 200 mg by mouth every 6 (six) hours as needed for moderate pain.   Yes [provider]  levothyroxine (SYNTHROID) 50 MCG tablet Take 50 mcg by mouth daily.   Yes [provider]  Multiple Vitamins-Minerals (MULTIVITAMINS THER. W/MINERALS) TABS Take 1 tablet by mouth daily.   Yes [provider]  Probiotic Product (PROBIOTIC DAILY PO) Take 1 tablet by mouth daily.    Yes [provider]  vitamin C (ASCORBIC ACID) 500 MG tablet Take 500 mg by mouth daily.   Yes [provider]  b complex vitamins tablet Take 1 tablet by mouth daily.    [provider]    ___________________________________________________________________________________________________ Physical Exam:    07/23/2023   10:05 PM 07/23/2023    8:00 PM 07/23/2023    7:00 PM  Vitals with BMI  Systolic 136 129 161  Diastolic 44 54 51  Pulse 77 77 79     1. General:  in No ***Acute distress***increased work of breathing ***complaining of severe pain****agitated * Chronically ill *well *cachectic *toxic acutely ill -appearing 2. Psychological: Alert and *** Oriented 3. Head/ENT:   Moist *** Dry Mucous Membranes                          Head Non traumatic, neck supple                          Normal *** Poor Dentition 4. SKIN: normal *** decreased Skin turgor,  Skin clean Dry and intact no rash    5. Heart: Regular rate and rhythm no*** Murmur, no Rub or gallop 6. Lungs: ***Clear to auscultation bilaterally, no wheezes or crackles   7. Abdomen: Soft, ***non-tender, Non distended *** obese ***bowel sounds present 8. Lower extremities: no clubbing, cyanosis, no ***edema 9. Neurologically Grossly intact, moving all 4 extremities equally *** strength 5 out of 5 in all 4 extremities cranial nerves II through XII intact 10. MSK: Normal range of motion    Chart has been  reviewed  ______________________________________________________________________________________________  Assessment/Plan  ***  Admitted for *** SBO (small bowel obstruction) (HCC) ***    Present on Admission: . SBO (small bowel obstruction) (HCC)     No problem-specific Assessment & Plan notes found for this encounter.   Other plan as per orders.  DVT prophylaxis:  SCD  Code Status:    Code Status: Not on file FULL CODE as per patient  I had personally discussed CODE STATUS with patient  ACP  none   Family Communication:   Family not at  Bedside    Diet clear   Disposition Plan:      To home once workup is complete and patient is stable   Following barriers for discharge:                                                       Electrolytes corrected  Anemia corrected h/H stable                             Pain controlled with PO medications                               Afebrile, white count improving able to transition to PO antibiotics                             Will need to be able to tolerate PO                            Will likely need home health, home O2, set up                           Will need consultants to evaluate patient prior to discharge       Consult Orders  (From admission, onward)           Start     Ordered   07/23/23 1731  Consult to hospitalist  Called [facility] @18 :00 for consult to Hospitalist.  Spoke with Vinetta Bergamo  Once       Provider:  (Not yet assigned)  Question Answer Comment  Place call to: [facility]   Reason for Consult Admit      07/23/23 1730                              ***Would benefit from PT/OT eval prior to DC  Ordered                   Swallow eval - SLP ordered                   Diabetes care coordinator                   Transition of care consulted                   Nutrition    consulted                  Wound care  consulted                   Palliative care     consulted                   Behavioral health  consulted                    Consults called: ***   Treatment Team:  Ccs, Md, MD  Admission status:  ED Disposition     ED Disposition  Admit   Condition  --   Comment  Hospital Area: [facility] [[id number]]  Level of Care: Med-Surg [16]  Interfacility transfer: Yes  May place patient in observation at [facility] or [facility] if equivalent level of care is available:: Yes  Covid Evaluation: Asymptomatic - no recent exposure (last 10 days) testing not required  Diagnosis: SBO (small bowel obstruction) [facility]) [[id number]]  Admitting Physician: Therisa Doyne [[id number]]  Attending Physician: Therisa Doyne [[id number]]           Obs***  ***  inpatient     I Expect 2 midnight stay secondary to severity of patient's current illness need for inpatient interventions justified by the following: ***hemodynamic instability despite optimal treatment (tachycardia *hypotension * tachypnea *hypoxia, hypercapnia) * Severe lab/radiological/exam abnormalities including:     and extensive comorbidities including: *substance abuse  *Chronic pain *DM2  * CHF * CAD  * COPD/asthma *Morbid Obesity * CKD *dementia *liver disease *history of stroke with residual deficits *  malignancy, * sickle cell disease  History of amputation Chronic anticoagulation  That are currently affecting medical management.   I expect  patient to be hospitalized for 2 midnights requiring inpatient medical care.  Patient is at high risk for adverse outcome (such as loss of life or disability) if not treated.  Indication for inpatient stay as follows:  Severe change from baseline regarding mental status Hemodynamic instability despite maximal medical therapy,  ongoing suicidal ideations,  severe pain requiring acute inpatient management,  inability to maintain oral hydration   persistent chest pain despite medical management Need for  operative/procedural  intervention New or worsening hypoxia   Need for IV antibiotics, IV fluids, IV rate controling medications, IV antihypertensives, IV pain medications, IV anticoagulation, need for biPAP    Level of care   *** tele  For 12H 24H     medical floor       progressive     stepdown   tele indefinitely please discontinue once patient no longer qualifies COVID-19 Labs   Kanav Kazmierczak 07/23/2023, 11:49 PM ***  [facility]     after 2 AM please page floor coverage PA If 7AM-7PM, please contact the day team taking care of the patient using [email or web address]

## 2023-07-23 NOTE — ED Triage Notes (Signed)
Pt reports she was sent here by [facility] for a bowel obstruction, CT done this morning, LBM 07/21/23

## 2023-07-23 NOTE — ED Notes (Signed)
Received call from [facility] for transport, No Current ETA ED Nurse will call the floor to give report Received call @ 19:48

## 2023-07-23 NOTE — ED Provider Notes (Signed)
[facility] Provider Note   CSN: [id number] Arrival date & time: 07/23/23  1639     History Chief Complaint  Patient presents with   Constipation   Small Bowel Obstruction    HPI Sara Bryan is a 83 y.o. female presenting for chief complaint of constipation. Vomiting and PO intake intolerance as well as abdominal pain. Gradually worsening over the past few years.  CCS did hernia repairs about 10 years ago and 2 c section.  Colonoscopies with [employer] PCP [facility]. Caleb Tunnel   Patient's recorded medical, surgical, social, medication list and allergies were reviewed in the Snapshot window as part of the initial history.   Review of Systems   Review of Systems  Constitutional:  Negative for chills and fever.  HENT:  Negative for ear pain and sore throat.   Eyes:  Negative for pain and visual disturbance.  Respiratory:  Negative for cough and shortness of breath.   Cardiovascular:  Negative for chest pain and palpitations.  Gastrointestinal:  Positive for abdominal pain, nausea and vomiting.  Genitourinary:  Negative for dysuria and hematuria.  Musculoskeletal:  Negative for arthralgias and back pain.  Skin:  Negative for color change and rash.  Neurological:  Negative for seizures and syncope.  All other systems reviewed and are negative.   Physical Exam Updated Vital Signs BP (!) 136/44 (BP Location: Right Arm)   Pulse 77   Temp 98 F (36.7 C)   Resp 19   Ht 5\' 4"  (1.626 m)   Wt 87.1 kg   SpO2 97%   BMI 32.96 kg/m  Physical Exam Vitals and nursing note reviewed.  Constitutional:      General: She is not in acute distress.    Appearance: She is well-developed.  HENT:     Head: Normocephalic and atraumatic.  Eyes:     Conjunctiva/sclera: Conjunctivae normal.  Cardiovascular:     Rate and Rhythm: Normal rate and regular rhythm.     Heart sounds: No murmur heard. Pulmonary:     Effort: Pulmonary  effort is normal. No respiratory distress.     Breath sounds: Normal breath sounds.  Abdominal:     General: There is no distension.     Palpations: Abdomen is soft.     Tenderness: There is abdominal tenderness. There is no right CVA tenderness or left CVA tenderness.  Musculoskeletal:        General: No swelling or tenderness. Normal range of motion.     Cervical back: Neck supple.  Skin:    General: Skin is warm and dry.  Neurological:     General: No focal deficit present.     Mental Status: She is alert and oriented to person, place, and time. Mental status is at baseline.     Cranial Nerves: No cranial nerve deficit.      ED Course/ Medical Decision Making/ A&P Clinical Course as of 07/23/23 2208  Thu Jul 23, 2023  1907 [phone] Tyler Deis [place] MRN [id number] 21 YOF with SBO. Medcine admit, just alerting gen surg Thanks! [phone] [CC]    Clinical Course User Index [CC] Glyn Ade, MD    Procedures Procedures   Medications Ordered in ED Medications  morphine (PF) 4 MG/ML injection 4 mg (4 mg Intravenous Given 07/23/23 1839)  ondansetron (ZOFRAN) injection 4 mg (4 mg Intravenous Given 07/23/23 1839)  lactated ringers infusion ( Intravenous New Bag/Given 07/23/23 1837)    Medical Decision Making:  Patient presenting to a stand-alone emergency department with a known bowel small bowel obstruction.  Paged general surgery as above.  Consulted medicine for admission.  Lab work performed without any focal pathology.  Patient declined NG tube.  Disposition:   Based on the above findings, I believe this patient is stable for admission.    Patient/family educated about specific findings on our evaluation and explained exact reasons for admission.  Patient/family educated about clinical situation and time was allowed to answer questions.   Admission team communicated with and agreed with need for admission. Patient admitted. Patient ready to move at this  time.     Emergency Department Medication Summary:   Medications  morphine (PF) 4 MG/ML injection 4 mg (4 mg Intravenous Given 07/23/23 1839)  ondansetron (ZOFRAN) injection 4 mg (4 mg Intravenous Given 07/23/23 1839)  lactated ringers infusion ( Intravenous New Bag/Given 07/23/23 1837)    Clinical Impression:  1. SBO (small bowel obstruction) (HCC)      Admit   Final Clinical Impression(s) / ED Diagnoses Final diagnoses:  SBO (small bowel obstruction) (HCC)    Rx / DC Orders ED Discharge Orders     None         Glyn Ade, MD 07/23/23 2208

## 2023-07-24 ENCOUNTER — Observation Stay (HOSPITAL_COMMUNITY): Payer: Medicare Other

## 2023-07-24 DIAGNOSIS — J9811 Atelectasis: Secondary | ICD-10-CM | POA: Diagnosis not present

## 2023-07-24 DIAGNOSIS — E039 Hypothyroidism, unspecified: Secondary | ICD-10-CM | POA: Diagnosis present

## 2023-07-24 DIAGNOSIS — J9601 Acute respiratory failure with hypoxia: Secondary | ICD-10-CM | POA: Diagnosis present

## 2023-07-24 DIAGNOSIS — K5669 Other partial intestinal obstruction: Secondary | ICD-10-CM | POA: Diagnosis not present

## 2023-07-24 DIAGNOSIS — K56609 Unspecified intestinal obstruction, unspecified as to partial versus complete obstruction: Secondary | ICD-10-CM | POA: Diagnosis not present

## 2023-07-24 LAB — CBC
HCT: 42.8 % (ref 36.0–46.0)
Hemoglobin: 13.8 g/dL (ref 12.0–15.0)
MCH: 30.1 pg (ref 26.0–34.0)
MCHC: 32.2 g/dL (ref 30.0–36.0)
MCV: 93.4 fL (ref 80.0–100.0)
Platelets: 223 10*3/uL (ref 150–400)
RBC: 4.58 MIL/uL (ref 3.87–5.11)
RDW: 13 % (ref 11.5–15.5)
WBC: 13.2 10*3/uL — ABNORMAL HIGH (ref 4.0–10.5)
nRBC: 0 % (ref 0.0–0.2)

## 2023-07-24 LAB — COMPREHENSIVE METABOLIC PANEL
ALT: 69 U/L — ABNORMAL HIGH (ref 0–44)
AST: 95 U/L — ABNORMAL HIGH (ref 15–41)
Albumin: 3 g/dL — ABNORMAL LOW (ref 3.5–5.0)
Alkaline Phosphatase: 63 U/L (ref 38–126)
Anion gap: 10 (ref 5–15)
BUN: 23 mg/dL (ref 8–23)
CO2: 25 mmol/L (ref 22–32)
Calcium: 8.4 mg/dL — ABNORMAL LOW (ref 8.9–10.3)
Chloride: 100 mmol/L (ref 98–111)
Creatinine, Ser: 0.71 mg/dL (ref 0.44–1.00)
GFR, Estimated: >60 mL/min (ref >60)
Glucose, Bld: 90 mg/dL (ref 70–99)
Potassium: 4 mmol/L (ref 3.5–5.1)
Sodium: 135 mmol/L (ref 135–145)
Total Bilirubin: 3.6 mg/dL — ABNORMAL HIGH (ref 0.0–1.2)
Total Protein: 5.9 g/dL — ABNORMAL LOW (ref 6.5–8.1)

## 2023-07-24 LAB — PHOSPHORUS: Phosphorus: 3.2 mg/dL (ref 2.5–4.6)

## 2023-07-24 LAB — MAGNESIUM: Magnesium: 1.9 mg/dL (ref 1.7–2.4)

## 2023-07-24 MED ORDER — FENTANYL CITRATE PF 50 MCG/ML IJ SOSY
12.5000 ug | PREFILLED_SYRINGE | INTRAMUSCULAR | Status: DC | PRN
  Filled 2023-07-24: qty 1

## 2023-07-24 MED ORDER — HYDROCODONE-ACETAMINOPHEN 5-325 MG PO TABS: 1.0000 | ORAL_TABLET | ORAL | Status: DC | PRN

## 2023-07-24 MED ORDER — ACETAMINOPHEN 325 MG PO TABS: 650.0000 mg | ORAL_TABLET | Freq: Four times a day (QID) | ORAL | Status: DC | PRN

## 2023-07-24 MED ORDER — ACETAMINOPHEN 650 MG RE SUPP: 650.0000 mg | Freq: Four times a day (QID) | RECTAL | Status: DC | PRN

## 2023-07-24 MED ORDER — PANTOPRAZOLE SODIUM 40 MG IV SOLR
40.0000 mg | INTRAVENOUS | Status: DC
  Administered 2023-07-24 – 2023-07-25 (×2): 40 mg via INTRAVENOUS
  Filled 2023-07-24 (×2): qty 10

## 2023-07-24 MED ORDER — LEVOTHYROXINE SODIUM 50 MCG PO TABS
50.0000 ug | ORAL_TABLET | Freq: Every day | ORAL | Status: DC
  Administered 2023-07-24 – 2023-07-25 (×2): 50 ug via ORAL
  Filled 2023-07-24 (×2): qty 1

## 2023-07-24 NOTE — Care Management Obs Status (Signed)
MEDICARE OBSERVATION STATUS NOTIFICATION   Patient Details  Name: Sara Bryan MRN: [id number] Date of Birth: December 10, 1940   Medicare Observation Status Notification Given:       Howell Rucks, RN 07/24/2023, 10:52 AM

## 2023-07-24 NOTE — Assessment & Plan Note (Signed)
-   Check TSH continue home medications Synthroid at 50 mcg po q day

## 2023-07-24 NOTE — Assessment & Plan Note (Signed)
Chronic stable restart Crestor once able to tolerate

## 2023-07-24 NOTE — Assessment & Plan Note (Signed)
In the setting of opioid use.  Patient currently on oxygen at baseline does not require any oxygen.  Will see if able to wean down avoid IV morphine as this was the culprit.  Patient currently alert and oriented continue to monitor obtain chest x-ray for completion

## 2023-07-24 NOTE — TOC CM/SW Note (Signed)
Transition of Care [facility]) - Inpatient Brief Assessment   Patient Details  Name: Sara Bryan MRN: [id number] Date of Birth: [birth year]  Transition [facility]) CM/SW Contact:    Howell Rucks, RN Phone Number: 07/24/2023, 10:57 AM   Clinical Narrative: Met with pt at  bedside to introduce role of TOC/NCM and review for dc planning, pt reports she has an established PCP and pharmacy, no current home care services or home DME, pt reports she feels safe returning home with support from family,confirmed transportation available at discharge. MOON explained, form signed, copy provided to pt. TOC Brief Assessment completed. No TOC needs identified at this time.     Transition of Care Asessment: Insurance and Status: Insurance coverage has been reviewed Patient has primary care physician: Yes Home environment has been reviewed: resides in private residence Prior level of function:: Independent Prior/Current Home Services: No current home services Social Drivers of Health Review: SDOH reviewed no interventions necessary Readmission risk has been reviewed: Yes Transition of care needs: no transition of care needs at this time

## 2023-07-24 NOTE — Plan of Care (Signed)

## 2023-07-24 NOTE — Plan of Care (Addendum)
VSS. Patient had multiple BMs overnight. LR infusing at 163ml/hr. Patient on continuous pulse ox with resting O2 sat >94%, intermittently desat'd to upper 80s with ambulation to bathroom overnight.   Problem: Education: Goal: Knowledge of General Education information will improve Description: Including pain rating scale, medication(s)/side effects and non-pharmacologic comfort measures Outcome: Progressing   Problem: Health Behavior/Discharge Planning: Goal: Ability to manage health-related needs will improve Outcome: Progressing   Problem: Clinical Measurements: Goal: Ability to maintain clinical measurements within normal limits will improve Outcome: Progressing   Problem: Nutrition: Goal: Adequate nutrition will be maintained Outcome: Progressing   Problem: Elimination: Goal: Will not experience complications related to bowel motility Outcome: Progressing   Problem: Pain Managment: Goal: General experience of comfort will improve and/or be controlled Outcome: Progressing   Problem: Safety: Goal: Ability to remain free from injury will improve Outcome: Progressing

## 2023-07-24 NOTE — Progress Notes (Signed)
PROGRESS NOTE  Sara Bryan  WUJ:[id number] DOB: 06/09/1941 DOA: 07/23/2023 PCP: Tally Joe, MD   Brief Narrative: Patient is 60 female with history of hypothyroidism, hyperlipidemia who presented with complaints of vomiting, abdominal pain, no bowel movement.  History of abdominal surgeries including hernia repair, 2 C-sections.  Abdominal imaging on presentation showed possible small bowel obstruction.  General surgery consulted.  Currently being managed for SBO.  Started having bowel movement this morning.  Started on clear liquid diet.  If clinically improving, plan to advance her diet tomorrow and discharge.  Assessment & Plan:  Principal Problem:   SBO (small bowel obstruction) (HCC) Active Problems:   Hyperlipidemia   Acute respiratory failure with hypoxia (HCC)   Hypothyroidism  SBO: Presented with abdomen pain, nausea, lack of bowel movement.  CT imaging showed SBO.  Started on conservative management.  On presentation, she refused NG tube.  General surgery following.  Abdominal x-ray this morning shows  mid to distal small bowel Obstruction but during my evaluation this morning, she was started having liquidy bowel movement.  Abdomen is soft, nontender, nondistended.  She does not have any abdomen pain, nausea or vomiting.  Started on clear liquid diet.  General surgery recommended to continue same for today, advance the diet tomorrow and possible discharge home  Acute hypoxic respiratory failure: Likely in the setting of opiate use( was given morphine),atelectasis  Chest x-ray with bibasilar atelectasis.  Continue incentive spirometer.  Has been weaned to room air now  Distal esophagitis: As seen on the CT imaging.  Will start on Protonix  Elevated liver enzymes: Mild.  Continue to monitor.  Check CMP tomorrow  Leukocytosis: Most likely reactive.  Low suspicion for concurrent infectious process  Hypothyroidism: Continue Synthyroid  Hyperlipidemia: Takes Crestor at  home         DVT prophylaxis:SCDs Start: 07/24/23 0009     Code Status: Full Code  Family Communication: Son at bedside  Patient status:Inpatient  Patient is from :home  Anticipated discharge NF:AOZH  Estimated DC date:tomorrow   Consultants: Surgery  Procedures:None  Antimicrobials:  Anti-infectives (From admission, onward)    None       Subjective: Patient seen and examined at bedside today.  Hemodynamically stable.  She looks comfortable during my evaluation, she started having diarrhea.  No abdomen pain, nausea or vomiting.  Abdomen is nondistended  Objective: Vitals:   07/23/23 2205 07/24/23 0158 07/24/23 0241 07/24/23 0602  BP: (!) 136/44 (!) 110/46  115/75  Pulse: 77 71  71  Resp: 19 18  18   Temp: 98 F (36.7 C) 98.2 F (36.8 C)  98.1 F (36.7 C)  TempSrc: Oral Oral    SpO2: 97% 96% 95% 96%  Weight:      Height:        Intake/Output Summary (Last 24 hours) at 07/24/2023 0865 Last data filed at 07/24/2023 0121 Gross per 24 hour  Intake 120 ml  Output --  Net 120 ml   Filed Weights   07/23/23 1650  Weight: 87.1 kg    Examination:  General exam: Overall comfortable, not in distress HEENT: PERRL Respiratory system:  no wheezes or crackles  Cardiovascular system: S1 & S2 heard, RRR.  Gastrointestinal system: Abdomen is nondistended, soft and nontender.  Sluggish bowel sounds Central nervous system: Alert and oriented Extremities: No edema, no clubbing ,no cyanosis Skin: No rashes, no ulcers,no icterus     Data Reviewed: I have personally reviewed following labs and imaging studies  CBC: Recent  Labs  Lab 07/23/23 1704 07/24/23 0428  WBC 14.2* 13.2*  NEUTROABS 10.4*  --   HGB 16.4* 13.8  HCT 48.5* 42.8  MCV 89.0 93.4  PLT 311 223   Basic Metabolic Panel: Recent Labs  Lab 07/23/23 1704 07/24/23 0428  NA 134* 135  K 3.8 4.0  CL 93* 100  CO2 30 25  GLUCOSE 97 90  BUN 25* 23  CREATININE 0.93 0.71  CALCIUM 9.4 8.4*  MG   --  1.9  PHOS  --  3.2     No results found for this or any previous visit (from the past 240 hours).   Radiology Studies: DG Abd 1 View Result Date: 07/24/2023 CLINICAL DATA:  Small-bowel obstruction EXAM: ABDOMEN - 1 VIEW COMPARISON:  07/23/2023 FINDINGS: Multiple gas-filled mildly dilated loops of small bowel are seen throughout the abdomen suggesting a mid to distal small bowel obstruction. No free intraperitoneal gas. Cholecystectomy clips are seen in the right upper quadrant. Abdominal wall mesh noted overlying the pelvis and right lower quadrant. Vascular calcifications noted within the left paravertebral region. Osseous structures are age-appropriate. IMPRESSION: 1. Radiographic findings suggesting a mid to distal small bowel obstruction. Electronically Signed   By: Helyn Numbers M.D.   On: 07/24/2023 01:34   DG CHEST PORT 1 VIEW Result Date: 07/24/2023 CLINICAL DATA:  [id number] Acute respiratory failure with hypoxia (HCC) [id number] EXAM: PORTABLE CHEST 1 VIEW COMPARISON:  None Available. FINDINGS: The lungs are symmetrically well expanded. There is diffuse interstitial coarsening, likely chronic in nature. Bibasilar atelectasis is present. No pneumothorax or pleural effusion. Cardiac size within normal limits. Pulmonary vascularity is normal. No acute bone abnormality. IMPRESSION: 1. Bibasilar atelectasis. Electronically Signed   By: Helyn Numbers M.D.   On: 07/24/2023 00:28   CT ABDOMEN PELVIS W CONTRAST Result Date: 07/23/2023 CLINICAL DATA:  Lower abdominal pain for 2 days. Hernia surgery in same area. History of melanoma. Cholecystectomy. EXAM: CT ABDOMEN AND PELVIS WITH CONTRAST TECHNIQUE: Multidetector CT imaging of the abdomen and pelvis was performed using the standard protocol following bolus administration of intravenous contrast. RADIATION DOSE REDUCTION: This exam was performed according to the departmental dose-optimization program which includes automated exposure control,  adjustment of the mA and/or kV according to patient size and/or use of iterative reconstruction technique. CONTRAST:  ISOVUE-300 IOPAMIDOL (ISOVUE-300) INJECTION 61% COMPARISON:  10/05/2012 CTA.  PET of 05/12/2016 FINDINGS: Lower chest: Motion degradation in the lower chest. A left lower lobe 3 mm nodule was similar on 03/04/2021 chest CT and is considered benign. Normal heart size without pericardial or pleural effusion. Mild distal esophageal wall thickening with possible periesophageal edema on 11/02. Hepatobiliary: Normal liver. Cholecystectomy, without biliary ductal dilatation. Pancreas: Fatty replaced pancreas. No duct dilatation or acute inflammation. Spleen: Normal in size, without focal abnormality. Adrenals/Urinary Tract: Normal adrenal glands. Normal kidneys, without hydronephrosis. Normal urinary bladder. Stomach/Bowel: Normal stomach, without wall thickening. Extensive colonic diverticulosis. Normal terminal ileum. Mid small bowel loops are dilated and fluid-filled including up to 3.2 cm on 45/2. At the level of the mid to distal ileum, a transition point is identified within the mid right portion of the ventral abdominal wall hernia repair site. There is residual laxity in this area containinga both small-bowel (62/2) and just superiorly, nonobstructive transverse colon on 60/2. No complicating ischemia. Vascular/Lymphatic: Aortic atherosclerosis. No abdominopelvic adenopathy. Reproductive: Small uterine calcification could represent a dystrophic fibroid or be vascular. No adnexal mass. Other: No significant free fluid. Mild pelvic floor laxity. No free  intraperitoneal air. Separate more cephalad small ventral abdominal wall laxity site containing fat on 35/2. Musculoskeletal: Lumbosacral spondylosis. Lucent subcentimeter L3 lesion is similar to 2014 and considered benign. Mild convex left lumbar spine curvature. IMPRESSION: 1. Status post ventral abdominal wall hernia repair. Small-bowel  obstruction with transition identified within an area of residual/recurrent wall laxity. No complicating ischemia identified. 2. Possible distal esophagitis. Mild wall thickening with possible periesophageal edema. 3.  Aortic Atherosclerosis (ICD10-I70.0). These results will be called to the ordering clinician or representative by the Radiologist Assistant, and communication documented in the PACS or [employer]. Electronically Signed   By: Jeronimo Greaves M.D.   On: 07/23/2023 14:16    Scheduled Meds:  levothyroxine  50 mcg Oral Daily   Continuous Infusions:  lactated ringers 100 mL/hr at 07/23/23 1837     LOS: 0 days   Burnadette Pop, MD [facility] P2/14/2025, 8:12 AM

## 2023-07-24 NOTE — Assessment & Plan Note (Signed)
Likely cause prior surgery  - admit for conservative management  - Patient refused NG tube and currently tolerating clears   - KUB in AM - appreciate General surgery consult.\

## 2023-07-24 NOTE — Consult Note (Signed)
Consult Note  Sara Bryan 07/03/1940  [id number].    Requesting MD: Therisa Doyne, MD Chief Complaint/Reason for Consult: SBO HPI:  Patient is an 83 year old female with PMH of HLD and hypothyroidism and previous ventral hernia repairs who presented to the ED at direction of PCP with 3 day hx of constipation, decreased PO intake and nausea and vomiting. She has had some chronic abdominal pain that has gradually worsened over several years. Other previous abdominal surgery includes laparoscopic cholecystectomy and cesarean section. She saw PCP and outpatient CT was done which showed possible SBO and she was directed to ED. Reportedly she was somewhat improved in the ED and able to tolerate CLD. She declined NGT placement. Currently abdominal pain is resolved and she is having bowel movements. Patient is not on any blood thinners.     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   Hernia    History of hiatal hernia    Hyperlipidemia    Supraventricular tachycardia (HCC) 2007   once- cardiologist- dr Elease Hashimoto; yearly   Varicose veins     Past Surgical History:  Procedure Laterality Date   CESAREAN SECTION     CHOLECYSTECTOMY N/A 03/19/2016   Procedure: LAPAROSCOPIC CHOLECYSTECTOMY WITH INTRAOPERATIVE CHOLANGIOGRAM;  Surgeon: Claud Kelp, MD;  Location: [facility] OR;  Service: General;  Laterality: N/A;   HERNIA REPAIR  1975   abd wall   HERNIA REPAIR     HERNIA REPAIR     HERNIA REPAIR     MELANOMA EXCISION     from back in 2007    Social History:  reports that she quit smoking about 32 years  ago. Her smoking use included cigarettes. She started smoking about 63 years ago. She has a 15.8 pack-year smoking history. She has never used smokeless tobacco. She reports that she does not drink alcohol and does not use drugs.  Allergies:  Allergies  Allergen Reactions   Levaquin [Levofloxacin] Other (See Comments)    Diarrhea/Muscle weakness/inability to lift arms/limbs   Aspirin Nausea And Vomiting and Nausea Only    Enteric is ok   Atorvastatin Other (See Comments)    Other reaction(s): myalgias   Codeine Nausea Only    Medications Prior to Admission  Medication Sig Dispense Refill   acetaminophen (TYLENOL) 500 MG tablet Take 1,000 mg by mouth every 6 (six) hours as needed for mild pain (pain score 1-3).     aspirin EC 81 MG tablet Take 81 mg by mouth daily.     CALCIUM PO Take 3 tablets by mouth daily.     Cholecalciferol (VITAMIN D-3) 125 MCG (5000 UT) TABS Take 5,000 Units by mouth daily.     cholecalciferol (VITAMIN D3) 25 MCG (1000 UNIT) tablet Take 1,000 Units by mouth daily.     Coenzyme Q10 (CO Q-10 PO) Take 100 mg by mouth daily.     fish oil-omega-3 fatty acids 1000 MG capsule Take 1 g by  mouth daily.     ibuprofen (ADVIL,MOTRIN) 200 MG tablet Take 400 mg by mouth every 6 (six) hours as needed for moderate pain (pain score 4-6).     levothyroxine (SYNTHROID) 75 MCG tablet Take 75 mcg by mouth daily before breakfast.     ondansetron (ZOFRAN) 8 MG tablet Take 8 mg by mouth daily as needed for nausea or vomiting.     Probiotic Product (PROBIOTIC DAILY PO) Take 1 tablet by mouth daily.      rosuvastatin (CRESTOR) 5 MG tablet Take 5 mg by mouth daily.      Blood pressure 115/75, pulse 71, temperature 98.1 F (36.7 C), resp. rate 18, height 5\' 4"  (1.626 m), weight 87.1 kg, SpO2 96%. Physical Exam:  General: pleasant, WD, female who is laying in bed in NAD HEENT: head is normocephalic, atraumatic.  Sclera are noninjected.  PERRL.  Ears and nose without any masses or  lesions.  Mouth is pink and moist Heart: regular, rate, and rhythm.  Normal s1,s2. No obvious murmurs, gallops, or rubs noted.  Palpable radial and pedal pulses bilaterally Lungs: CTAB, no wheezes, rhonchi, or rales noted.  Respiratory effort nonlabored Abd: soft, NT, ND, +BS, no masses, hernias, or organomegaly MS: all 4 extremities are symmetrical with no cyanosis, clubbing, or edema. Skin: warm and dry with no masses, lesions, or rashes Neuro: Cranial nerves 2-12 grossly intact, sensation is normal throughout Psych: A&Ox3 with an appropriate affect.   Results for orders placed or performed during the hospital encounter of 07/23/23 (from the past 48 hours)  Comprehensive metabolic panel     Status: Abnormal   Collection Time: 07/23/23  5:04 PM  Result Value Ref Range   Sodium 134 (L) 135 - 145 mmol/L   Potassium 3.8 3.5 - 5.1 mmol/L   Chloride 93 (L) 98 - 111 mmol/L   CO2 30 22 - 32 mmol/L   Glucose, Bld 97 70 - 99 mg/dL    Comment: Glucose reference range applies only to samples taken after fasting for at least 8 hours.   BUN 25 (H) 8 - 23 mg/dL   Creatinine, Ser 4.09 0.44 - 1.00 mg/dL   Calcium 9.4 8.9 - 81.1 mg/dL   Total Protein 8.1 6.5 - 8.1 g/dL   Albumin 4.0 3.5 - 5.0 g/dL   AST 32 15 - 41 U/L   ALT 25 0 - 44 U/L   Alkaline Phosphatase 69 38 - 126 U/L   Total Bilirubin 4.5 (H) 0.0 - 1.2 mg/dL   GFR, Estimated >91 >47 mL/min    Comment: (NOTE) Calculated using the CKD-EPI Creatinine Equation (2021)    Anion gap 11 5 - 15    Comment: Performed at [facility], [address] [place], [place] [address]  CBC with Differential     Status: Abnormal   Collection Time: 07/23/23  5:04 PM  Result Value Ref Range   WBC 14.2 (H) 4.0 - 10.5 K/uL   RBC 5.45 (H) 3.87 - 5.11 MIL/uL   Hemoglobin 16.4 (H) 12.0 - 15.0 g/dL   HCT 21.3 (H) 08.6 - 57.8 %   MCV 89.0 80.0 - 100.0 fL   MCH 30.1 26.0 - 34.0 pg   MCHC 33.8 30.0 - 36.0 g/dL   RDW 46.9 62.9 - 52.8 %   Platelets  311 150 - 400 K/uL   nRBC 0.0 0.0 - 0.2 %   Neutrophils Relative % 74 %   Neutro Abs 10.4 (H) 1.7 - 7.7  K/uL   Lymphocytes Relative 15 %   Lymphs Abs 2.1 0.7 - 4.0 K/uL   Monocytes Relative 11 %   Monocytes Absolute 1.6 (H) 0.1 - 1.0 K/uL   Eosinophils Relative 0 %   Eosinophils Absolute 0.0 0.0 - 0.5 K/uL   Basophils Relative 0 %   Basophils Absolute 0.0 0.0 - 0.1 K/uL   Immature Granulocytes 0 %   Abs Immature Granulocytes 0.05 0.00 - 0.07 K/uL    Comment: Performed at [facility], [address] [place], [place] [address]  Magnesium     Status: None   Collection Time: 07/24/23  4:28 AM  Result Value Ref Range   Magnesium 1.9 1.7 - 2.4 mg/dL    Comment: Performed at [facility], [address] [place], [place] [address]  Phosphorus     Status: None   Collection Time: 07/24/23  4:28 AM  Result Value Ref Range   Phosphorus 3.2 2.5 - 4.6 mg/dL    Comment: Performed at [facility], [address], [place], [place] [address]  Comprehensive metabolic panel     Status: Abnormal   Collection Time: 07/24/23  4:28 AM  Result Value Ref Range   Sodium 135 135 - 145 mmol/L   Potassium 4.0 3.5 - 5.1 mmol/L   Chloride 100 98 - 111 mmol/L   CO2 25 22 - 32 mmol/L   Glucose, Bld 90 70 - 99 mg/dL    Comment: Glucose reference range applies only to samples taken after fasting for at least 8 hours.   BUN 23 8 - 23 mg/dL   Creatinine, Ser 2.13 0.44 - 1.00 mg/dL   Calcium 8.4 (L) 8.9 - 10.3 mg/dL   Total Protein 5.9 (L) 6.5 - 8.1 g/dL   Albumin 3.0 (L) 3.5 - 5.0 g/dL   AST 95 (H) 15 - 41 U/L   ALT 69 (H) 0 - 44 U/L   Alkaline Phosphatase 63 38 - 126 U/L   Total Bilirubin 3.6 (H) 0.0 - 1.2 mg/dL   GFR, Estimated >08 >65 mL/min    Comment: (NOTE) Calculated using the CKD-EPI Creatinine Equation (2021)    Anion gap 10 5 - 15    Comment: Performed at [facility] [address], [place], [place] [address]  CBC      Status: Abnormal   Collection Time: 07/24/23  4:28 AM  Result Value Ref Range   WBC 13.2 (H) 4.0 - 10.5 K/uL   RBC 4.58 3.87 - 5.11 MIL/uL   Hemoglobin 13.8 12.0 - 15.0 g/dL   HCT 62.9 52.8 - 41.3 %   MCV 93.4 80.0 - 100.0 fL   MCH 30.1 26.0 - 34.0 pg   MCHC 32.2 30.0 - 36.0 g/dL   RDW 24.4 01.0 - 27.2 %   Platelets 223 150 - 400 K/uL   nRBC 0.0 0.0 - 0.2 %    Comment: Performed at [facility], [address], [place], [place] [address]   DG Abd 1 View Result Date: 07/24/2023 CLINICAL DATA:  Small-bowel obstruction EXAM: ABDOMEN - 1 VIEW COMPARISON:  07/23/2023 FINDINGS: Multiple gas-filled mildly dilated loops of small bowel are seen throughout the abdomen suggesting a mid to distal small bowel obstruction. No free intraperitoneal gas. Cholecystectomy clips are seen in the right upper quadrant. Abdominal wall mesh noted overlying the pelvis and right lower quadrant. Vascular calcifications noted within the left paravertebral region. Osseous structures are age-appropriate. IMPRESSION: 1.  Radiographic findings suggesting a mid to distal small bowel obstruction. Electronically Signed   By: Helyn Numbers M.D.   On: 07/24/2023 01:34   DG CHEST PORT 1 VIEW Result Date: 07/24/2023 CLINICAL DATA:  [id number] Acute respiratory failure with hypoxia (HCC) [id number] EXAM: PORTABLE CHEST 1 VIEW COMPARISON:  None Available. FINDINGS: The lungs are symmetrically well expanded. There is diffuse interstitial coarsening, likely chronic in nature. Bibasilar atelectasis is present. No pneumothorax or pleural effusion. Cardiac size within normal limits. Pulmonary vascularity is normal. No acute bone abnormality. IMPRESSION: 1. Bibasilar atelectasis. Electronically Signed   By: Helyn Numbers M.D.   On: 07/24/2023 00:28   CT ABDOMEN PELVIS W CONTRAST Result Date: 07/23/2023 CLINICAL DATA:  Lower abdominal pain for 2 days. Hernia surgery in same area. History of melanoma. Cholecystectomy. EXAM: CT  ABDOMEN AND PELVIS WITH CONTRAST TECHNIQUE: Multidetector CT imaging of the abdomen and pelvis was performed using the standard protocol following bolus administration of intravenous contrast. RADIATION DOSE REDUCTION: This exam was performed according to the departmental dose-optimization program which includes automated exposure control, adjustment of the mA and/or kV according to patient size and/or use of iterative reconstruction technique. CONTRAST:  ISOVUE-300 IOPAMIDOL (ISOVUE-300) INJECTION 61% COMPARISON:  10/05/2012 CTA.  PET of 05/12/2016 FINDINGS: Lower chest: Motion degradation in the lower chest. A left lower lobe 3 mm nodule was similar on 03/04/2021 chest CT and is considered benign. Normal heart size without pericardial or pleural effusion. Mild distal esophageal wall thickening with possible periesophageal edema on 11/02. Hepatobiliary: Normal liver. Cholecystectomy, without biliary ductal dilatation. Pancreas: Fatty replaced pancreas. No duct dilatation or acute inflammation. Spleen: Normal in size, without focal abnormality. Adrenals/Urinary Tract: Normal adrenal glands. Normal kidneys, without hydronephrosis. Normal urinary bladder. Stomach/Bowel: Normal stomach, without wall thickening. Extensive colonic diverticulosis. Normal terminal ileum. Mid small bowel loops are dilated and fluid-filled including up to 3.2 cm on 45/2. At the level of the mid to distal ileum, a transition point is identified within the mid right portion of the ventral abdominal wall hernia repair site. There is residual laxity in this area containinga both small-bowel (62/2) and just superiorly, nonobstructive transverse colon on 60/2. No complicating ischemia. Vascular/Lymphatic: Aortic atherosclerosis. No abdominopelvic adenopathy. Reproductive: Small uterine calcification could represent a dystrophic fibroid or be vascular. No adnexal mass. Other: No significant free fluid. Mild pelvic floor laxity. No free  intraperitoneal air. Separate more cephalad small ventral abdominal wall laxity site containing fat on 35/2. Musculoskeletal: Lumbosacral spondylosis. Lucent subcentimeter L3 lesion is similar to 2014 and considered benign. Mild convex left lumbar spine curvature. IMPRESSION: 1. Status post ventral abdominal wall hernia repair. Small-bowel obstruction with transition identified within an area of residual/recurrent wall laxity. No complicating ischemia identified. 2. Possible distal esophagitis. Mild wall thickening with possible periesophageal edema. 3.  Aortic Atherosclerosis (ICD10-I70.0). These results will be called to the ordering clinician or representative by the Radiologist Assistant, and communication documented in the PACS or [employer]. Electronically Signed   By: Jeronimo Greaves M.D.   On: 07/23/2023 14:16      Assessment/Plan pSBO - CT yesterday with above with transition in mid to distal ileum  within mid right portion of prior hernia repair site, residual laxity in this area containing small bowel and non-obstructive transverse colon  - mild leukocytosis of 13K from 14K, afeb and HD stable  - abdominal exam benign, symptoms resolved - KUB this AM with mildly dilated gas filled loops    It looks like she has  an adhesive small bowel obstruction due to previous hernia repair mesh.  The CT is concerning, but her symptoms have resolved and she is having bowel function.  I recommend clear liquid diet, if she does well can advance diet tomorrow and possibly discharge.  Hopefully this was a one time partial obstruction that resolved and she doesn't need to have any surgery.  Surgery would have increased risks due to scar tissue from multiple previous surgeries and likely need for mesh removal.  FEN: CLD, IVF per [facility] VTE: ok to have SQH or LMWH from surgical standpoint  ID: no current abx  - per [facility] -  Hx of SVT HLD Hypothyroidism   I reviewed ED provider notes, hospitalist notes,  last 24 h vitals and pain scores, last 48 h intake and output, last 24 h labs and trends, and last 24 h imaging results.  This care required high  level of medical decision making.   Quentin Ore, MD  [facility] Surgery 07/24/2023, 9:44 AM Please see Amion for pager number during day hours 7:00am-4:30pm

## 2023-07-25 DIAGNOSIS — K56609 Unspecified intestinal obstruction, unspecified as to partial versus complete obstruction: Secondary | ICD-10-CM | POA: Diagnosis not present

## 2023-07-25 DIAGNOSIS — K5669 Other partial intestinal obstruction: Secondary | ICD-10-CM | POA: Diagnosis not present

## 2023-07-25 LAB — COMPREHENSIVE METABOLIC PANEL
ALT: 44 U/L (ref 0–44)
AST: 39 U/L (ref 15–41)
Albumin: 3.2 g/dL — ABNORMAL LOW (ref 3.5–5.0)
Alkaline Phosphatase: 61 U/L (ref 38–126)
Anion gap: 8 (ref 5–15)
BUN: 16 mg/dL (ref 8–23)
CO2: 29 mmol/L (ref 22–32)
Calcium: 8.5 mg/dL — ABNORMAL LOW (ref 8.9–10.3)
Chloride: 98 mmol/L (ref 98–111)
Creatinine, Ser: 0.79 mg/dL (ref 0.44–1.00)
GFR, Estimated: >60 mL/min (ref >60)
Glucose, Bld: 130 mg/dL — ABNORMAL HIGH (ref 70–99)
Potassium: 3.9 mmol/L (ref 3.5–5.1)
Sodium: 135 mmol/L (ref 135–145)
Total Bilirubin: 2.5 mg/dL — ABNORMAL HIGH (ref 0.0–1.2)
Total Protein: 6.4 g/dL — ABNORMAL LOW (ref 6.5–8.1)

## 2023-07-25 LAB — CBC
HCT: 44.1 % (ref 36.0–46.0)
Hemoglobin: 13.7 g/dL (ref 12.0–15.0)
MCH: 30 pg (ref 26.0–34.0)
MCHC: 31.1 g/dL (ref 30.0–36.0)
MCV: 96.5 fL (ref 80.0–100.0)
Platelets: 152 10*3/uL (ref 150–400)
RBC: 4.57 MIL/uL (ref 3.87–5.11)
RDW: 13 % (ref 11.5–15.5)
WBC: 9.3 10*3/uL (ref 4.0–10.5)
nRBC: 0 % (ref 0.0–0.2)

## 2023-07-25 MED ORDER — PANTOPRAZOLE SODIUM 40 MG PO TBEC: 40.0000 mg | DELAYED_RELEASE_TABLET | Freq: Every day | ORAL | 0 refills | Status: AC

## 2023-07-25 MED ORDER — ONDANSETRON HCL 8 MG PO TABS: 8.0000 mg | ORAL_TABLET | Freq: Every day | ORAL | 0 refills | Status: AC | PRN

## 2023-07-25 MED ORDER — PANTOPRAZOLE SODIUM 40 MG PO TBEC: 40.0000 mg | DELAYED_RELEASE_TABLET | Freq: Every day | ORAL | Status: DC

## 2023-07-25 MED ORDER — BOOST / RESOURCE BREEZE PO LIQD CUSTOM
1.0000 | Freq: Two times a day (BID) | ORAL | Status: DC
  Administered 2023-07-25 (×2): 1 via ORAL

## 2023-07-25 NOTE — Progress Notes (Signed)
Patient ID: Sara Bryan, female   DOB: 19-Aug-1940, 83 y.o.   MRN: [id number]   Acute Care Surgery Service Progress Note:    Chief Complaint/Subjective: Having loose stools Pain essentially resolved. Mild discomfort in LLQ No n/v  Objective: Vital signs in last 24 hours: Temp:  [97.7 F (36.5 C)-98 F (36.7 C)] 98 F (36.7 C) (02/14 1933) Pulse Rate:  [63-71] 63 (02/14 1933) Resp:  [16-18] 18 (02/14 1933) BP: (104-105)/(39-48) 105/48 (02/14 1933) SpO2:  [90 %-93 %] 90 % (02/14 1933) Last BM Date : 07/24/23  Intake/Output from previous day: 02/14 0701 - 02/15 0700 In: 960 [P.O.:960] Out: -  Intake/Output this shift: No intake/output data recorded.  Lungs:  nonlabored  Cardiovascular: reg  Abd: soft, nt, nd  Extremities: no edema, +SCDs  Neuro: alert, nonfocal  Lab Results: CBC  Recent Labs    07/24/23 0428 07/25/23 0440  WBC 13.2* 9.3  HGB 13.8 13.7  HCT 42.8 44.1  PLT 223 152   BMET Recent Labs    07/23/23 1704 07/24/23 0428  NA 134* 135  K 3.8 4.0  CL 93* 100  CO2 30 25  GLUCOSE 97 90  BUN 25* 23  CREATININE 0.93 0.71  CALCIUM 9.4 8.4*   LFT    Latest Ref Rng & Units 07/24/2023    4:28 AM 07/23/2023    5:04 PM 08/12/2022    8:48 AM  Hepatic Function  Total Protein 6.5 - 8.1 g/dL 5.9  8.1  7.5   Albumin 3.5 - 5.0 g/dL 3.0  4.0  4.0   AST 15 - 41 U/L 95  32  21   ALT 0 - 44 U/L 69  25  19   Alk Phosphatase 38 - 126 U/L 63  69  76   Total Bilirubin 0.0 - 1.2 mg/dL 3.6  4.5  1.2    PT/INR No results for input(s): "LABPROT", "INR" in the last 72 hours. ABG No results for input(s): "PHART", "HCO3" in the last 72 hours.  Invalid input(s): "PCO2", "PO2"  Studies/Results:  Anti-infectives: Anti-infectives (From admission, onward)    None       Medications: Scheduled Meds:  feeding supplement  1 Container Oral BID BM   levothyroxine  50 mcg Oral Daily   pantoprazole (PROTONIX) IV  40 mg Intravenous Q24H   Continuous  Infusions: PRN Meds:.acetaminophen **OR** acetaminophen, fentaNYL (SUBLIMAZE) injection, HYDROcodone-acetaminophen, ondansetron (ZOFRAN) IV  Assessment/Plan: Patient Active Problem List   Diagnosis Date Noted   Acute respiratory failure with hypoxia (HCC) 07/24/2023   Hypothyroidism 07/24/2023   SBO (small bowel obstruction) (HCC) 07/23/2023   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 No fever. No wbc. Having BMs.  Adv diet to FLD this am.  Discussed diet education with pt over next week or so  Data reviewed: vitals x 24hrs, labs x 36hrs, xray, consult and h&p notes; I/o x 24 hrs Disposition:  LOS: 0 days    Mary Sella. Andrey Campanile, MD, FACS General, Bariatric, & Minimally Invasive Surgery [phone] [facility] Surgery, [facility]

## 2023-07-25 NOTE — Plan of Care (Signed)

## 2023-07-25 NOTE — Discharge Summary (Signed)
Physician Discharge Summary   Patient: Sara Bryan MRN: [id number]  DOB: 1941/04/07   Admit:     Date of Admission: 07/23/2023 Admitted from: home   Discharge: Date of discharge: 07/25/23 Disposition: Home Condition at discharge: good  CODE STATUS: FULL CODE     Discharge Physician: Sunnie Nielsen, DO [facility]     PCP: Tally Joe, MD  Recommendations for Outpatient Follow-up:  Follow up with PCP Tally Joe, MD in 1-2 weeks Please obtain labs/tests: monitor TSH, consider repeat CBC, CMP in 1-2 weeks    Discharge Instructions     Diet - low sodium heart healthy   Complete by: As directed    Discharge instructions   Complete by: As directed    CONSTIPATION:  ALL THE TIME:  Lifestyle measures Hydration: drink plenty of water Physical activity: at least walking daily High-fiber foods Bulk forming laxatives - any of the following  Psyllium (Konsyl; Metamucil; Perdiem) Methylcellulose (Citrucel) Calcium polycarbophil (FiberCon; Fiber-Lax; Mitrolan) Wheat dextrin (Benefiber)    AS NEEDED FOR 3-5 DAYS AT A TIME OR ALL THE TIME 1-2 TIMES PER WEEK  (CAN TAKE ONE FROM EACH CATEGORY E.G. MIRALAX + SENNA)  Hyperosmolar or saline laxatives: Polyethylene glycol (MiraLAX, GlycoLax) Lactulose Sorbitol Magnesium hydroxide (Milk of Magnesia)  Magnesium citrate (Evac-Q-Mag)   Stimulant laxatives Senna (eg, Black Draught, Ex-Lax, Fletcher's, Castoria, Senokot)  Bisacodyl (eg, Correctol, Doxidan, Dulcolax). Taking stimulant laxatives regularly or in large amounts can cause side effects, including low potassium levels. Thus, you should take these drugs carefully if you must use them regularly.   Increase activity slowly   Complete by: As directed          Discharge Diagnoses: Principal Problem:   SBO (small bowel obstruction) (HCC) Active Problems:   Hyperlipidemia   Acute respiratory failure with hypoxia [facility])    Hypothyroidism      Hospital course / significant events:  66 female with history of hypothyroidism, hyperlipidemia who presented with complaints of vomiting, abdominal pain, no bowel movement.  History of abdominal surgeries including hernia repair, 2 C-sections.  Abdominal imaging on presentation showed possible small bowel obstruction.  General surgery consulted.  Currently being managed for SBO.  Started having bowel movement this morning.  Started on clear liquid diet. Tolerating full. OK to advance to solids at home.      Consultants:  General surgery  Procedures/Surgeries: none      ASSESSMENT & PLAN:   SBO: Presented with abdomen pain, nausea, lack of bowel movement.  CT imaging showed SBO.  Started on conservative management.  On presentation, she refused NG tube.  General surgery following.  Abdominal x-ray this morning shows  mid to distal small bowel Obstruction but during my evaluation this morning, she was started having liquidy bowel movement.  Abdomen is soft, nontender, nondistended.  She does not have any abdomen pain, nausea or vomiting.  Diet advancing, no pain, ok for dc home. DIcsussed constipation prevention and diet advancement    Acute hypoxic respiratory failure resolved: Likely in the setting of opiate use( was given morphine),atelectasis  Chest x-ray with bibasilar atelectasis.  Continue incentive spirometer.  Has been weaned to room air now   Distal esophagitis: As seen on the CT imaging.  Will start on Protonix   Elevated liver enzymes: Mild.  Continue to monitor.  Check CMP outpatient    Leukocytosis: Most likely reactive.  Low suspicion for concurrent infectious process   Hypothyroidism: Continue Synthyroid   Hyperlipidemia: Takes  Crestor at home              Discharge Instructions  Allergies as of 07/25/2023       Reactions   Levaquin [levofloxacin] Other (See Comments)   Diarrhea/Muscle weakness/inability to lift arms/limbs    Aspirin Nausea And Vomiting, Nausea Only   Enteric is ok   Atorvastatin Other (See Comments)   Other reaction(s): myalgias   Codeine Nausea Only        Medication List     TAKE these medications    acetaminophen 500 MG tablet Commonly known as: TYLENOL Take 1,000 mg by mouth every 6 (six) hours as needed for mild pain (pain score 1-3).   aspirin EC 81 MG tablet Take 81 mg by mouth daily.   CALCIUM PO Take 3 tablets by mouth daily.   CO Q-10 PO Take 100 mg by mouth daily.   fish oil-omega-3 fatty acids 1000 MG capsule Take 1 g by mouth daily.   ibuprofen 200 MG tablet Commonly known as: ADVIL Take 400 mg by mouth every 6 (six) hours as needed for moderate pain (pain score 4-6).   levothyroxine 75 MCG tablet Commonly known as: SYNTHROID Take 75 mcg by mouth daily before breakfast.   ondansetron 8 MG tablet Commonly known as: ZOFRAN Take 1 tablet (8 mg total) by mouth daily as needed for nausea or vomiting.   pantoprazole 40 MG tablet Commonly known as: Protonix Take 1 tablet (40 mg total) by mouth daily.   PROBIOTIC DAILY PO Take 1 tablet by mouth daily.   rosuvastatin 5 MG tablet Commonly known as: CRESTOR Take 5 mg by mouth daily.   Vitamin D-3 125 MCG (5000 UT) Tabs Take 5,000 Units by mouth daily. What changed: Another medication with the same name was removed. Continue taking this medication, and follow the directions you see here.         Follow-up Information     Tally Joe, MD. Schedule an appointment as soon as possible for a visit.   Specialty: Family Medicine Why: hospital follow up in next 2-3 weeks Contact information: [address], [address] [place] [address] [phone]                 Allergies  Allergen Reactions   Levaquin [Levofloxacin] Other (See Comments)    Diarrhea/Muscle weakness/inability to lift arms/limbs   Aspirin Nausea And Vomiting and Nausea Only    Enteric is ok   Atorvastatin Other (See  Comments)    Other reaction(s): myalgias   Codeine Nausea Only     Subjective: pt reports feelin gwell today, tolerating diet, eager for discharge home. Discussed w/ daughter over th ephone also and no concerns/all questions answered. No pain, (+)BM   Discharge Exam: BP (!) 101/48 (BP Location: Right Arm)   Pulse (!) 59   Temp 98.2 F (36.8 C) (Oral)   Resp 18   Ht 5\' 4"  (1.626 m)   Wt 87.1 kg   SpO2 95%   BMI 32.96 kg/m  General: Pt is alert, awake, not in acute distress Cardiovascular: RRR, S1/S2 +, no rubs, no gallops Respiratory: CTA bilaterally, no wheezing, no rhonchi Abdominal: Soft, NT, ND, bowel sounds + Extremities: no edema, no cyanosis     The results of significant diagnostics from this hospitalization (including imaging, microbiology, ancillary and laboratory) are listed below for reference.     Microbiology: No results found for this or any previous visit (from the past 240 hours).  Labs: BNP (last 3 results) No results for input(s): "BNP" in the last 8760 hours. Basic Metabolic Panel: Recent Labs  Lab 07/23/23 1704 07/24/23 0428 07/25/23 1102  NA 134* 135 135  K 3.8 4.0 3.9  CL 93* 100 98  CO2 30 25 29   GLUCOSE 97 90 130*  BUN 25* 23 16  CREATININE 0.93 0.71 0.79  CALCIUM 9.4 8.4* 8.5*  MG  --  1.9  --   PHOS  --  3.2  --    Liver Function Tests: Recent Labs  Lab 07/23/23 1704 07/24/23 0428 07/25/23 1102  AST 32 95* 39  ALT 25 69* 44  ALKPHOS 69 63 61  BILITOT 4.5* 3.6* 2.5*  PROT 8.1 5.9* 6.4*  ALBUMIN 4.0 3.0* 3.2*   No results for input(s): "LIPASE", "AMYLASE" in the last 168 hours. No results for input(s): "AMMONIA" in the last 168 hours. CBC: Recent Labs  Lab 07/23/23 1704 07/24/23 0428 07/25/23 0440  WBC 14.2* 13.2* 9.3  NEUTROABS 10.4*  --   --   HGB 16.4* 13.8 13.7  HCT 48.5* 42.8 44.1  MCV 89.0 93.4 96.5  PLT 311 223 152   Cardiac Enzymes: No results for input(s): "CKTOTAL", "CKMB", "CKMBINDEX", "TROPONINI"  in the last 168 hours. BNP: Invalid input(s): "POCBNP" CBG: No results for input(s): "GLUCAP" in the last 168 hours. D-Dimer No results for input(s): "DDIMER" in the last 72 hours. Hgb A1c No results for input(s): "HGBA1C" in the last 72 hours. Lipid Profile No results for input(s): "CHOL", "HDL", "LDLCALC", "TRIG", "CHOLHDL", "LDLDIRECT" in the last 72 hours. Thyroid function studies No results for input(s): "TSH", "T4TOTAL", "T3FREE", "THYROIDAB" in the last 72 hours.  Invalid input(s): "FREET3" Anemia work up No results for input(s): "VITAMINB12", "FOLATE", "FERRITIN", "TIBC", "IRON", "RETICCTPCT" in the last 72 hours. Urinalysis    Component Value Date/Time   COLORURINE YELLOW 04/14/2011 1322   APPEARANCEUR CLEAR 04/14/2011 1322   LABSPEC 1.026 04/14/2011 1322   PHURINE 7.0 04/14/2011 1322   GLUCOSEU NEGATIVE 04/14/2011 1322   HGBUR NEGATIVE 04/14/2011 1322   BILIRUBINUR NEGATIVE 04/14/2011 1322   KETONESUR NEGATIVE 04/14/2011 1322   PROTEINUR NEGATIVE 04/14/2011 1322   UROBILINOGEN 1.0 04/14/2011 1322   NITRITE NEGATIVE 04/14/2011 1322   LEUKOCYTESUR NEGATIVE 04/14/2011 1322   Sepsis Labs Recent Labs  Lab 07/23/23 1704 07/24/23 0428 07/25/23 0440  WBC 14.2* 13.2* 9.3   Microbiology No results found for this or any previous visit (from the past 240 hours). Imaging DG Abd 1 View Result Date: 07/24/2023 CLINICAL DATA:  Small-bowel obstruction EXAM: ABDOMEN - 1 VIEW COMPARISON:  07/23/2023 FINDINGS: Multiple gas-filled mildly dilated loops of small bowel are seen throughout the abdomen suggesting a mid to distal small bowel obstruction. No free intraperitoneal gas. Cholecystectomy clips are seen in the right upper quadrant. Abdominal wall mesh noted overlying the pelvis and right lower quadrant. Vascular calcifications noted within the left paravertebral region. Osseous structures are age-appropriate. IMPRESSION: 1. Radiographic findings suggesting a mid to distal small  bowel obstruction. Electronically Signed   By: Helyn Numbers M.D.   On: 07/24/2023 01:34   DG CHEST PORT 1 VIEW Result Date: 07/24/2023 CLINICAL DATA:  [id number] Acute respiratory failure with hypoxia (HCC) [id number] EXAM: PORTABLE CHEST 1 VIEW COMPARISON:  None Available. FINDINGS: The lungs are symmetrically well expanded. There is diffuse interstitial coarsening, likely chronic in nature. Bibasilar atelectasis is present. No pneumothorax or pleural effusion. Cardiac size within normal limits. Pulmonary vascularity is normal. No acute bone abnormality. IMPRESSION:  1. Bibasilar atelectasis. Electronically Signed   By: Helyn Numbers M.D.   On: 07/24/2023 00:28   CT ABDOMEN PELVIS W CONTRAST Result Date: 07/23/2023 CLINICAL DATA:  Lower abdominal pain for 2 days. Hernia surgery in same area. History of melanoma. Cholecystectomy. EXAM: CT ABDOMEN AND PELVIS WITH CONTRAST TECHNIQUE: Multidetector CT imaging of the abdomen and pelvis was performed using the standard protocol following bolus administration of intravenous contrast. RADIATION DOSE REDUCTION: This exam was performed according to the departmental dose-optimization program which includes automated exposure control, adjustment of the mA and/or kV according to patient size and/or use of iterative reconstruction technique. CONTRAST:  ISOVUE-300 IOPAMIDOL (ISOVUE-300) INJECTION 61% COMPARISON:  10/05/2012 CTA.  PET of 05/12/2016 FINDINGS: Lower chest: Motion degradation in the lower chest. A left lower lobe 3 mm nodule was similar on 03/04/2021 chest CT and is considered benign. Normal heart size without pericardial or pleural effusion. Mild distal esophageal wall thickening with possible periesophageal edema on 11/02. Hepatobiliary: Normal liver. Cholecystectomy, without biliary ductal dilatation. Pancreas: Fatty replaced pancreas. No duct dilatation or acute inflammation. Spleen: Normal in size, without focal abnormality. Adrenals/Urinary Tract: Normal  adrenal glands. Normal kidneys, without hydronephrosis. Normal urinary bladder. Stomach/Bowel: Normal stomach, without wall thickening. Extensive colonic diverticulosis. Normal terminal ileum. Mid small bowel loops are dilated and fluid-filled including up to 3.2 cm on 45/2. At the level of the mid to distal ileum, a transition point is identified within the mid right portion of the ventral abdominal wall hernia repair site. There is residual laxity in this area containinga both small-bowel (62/2) and just superiorly, nonobstructive transverse colon on 60/2. No complicating ischemia. Vascular/Lymphatic: Aortic atherosclerosis. No abdominopelvic adenopathy. Reproductive: Small uterine calcification could represent a dystrophic fibroid or be vascular. No adnexal mass. Other: No significant free fluid. Mild pelvic floor laxity. No free intraperitoneal air. Separate more cephalad small ventral abdominal wall laxity site containing fat on 35/2. Musculoskeletal: Lumbosacral spondylosis. Lucent subcentimeter L3 lesion is similar to 2014 and considered benign. Mild convex left lumbar spine curvature. IMPRESSION: 1. Status post ventral abdominal wall hernia repair. Small-bowel obstruction with transition identified within an area of residual/recurrent wall laxity. No complicating ischemia identified. 2. Possible distal esophagitis. Mild wall thickening with possible periesophageal edema. 3.  Aortic Atherosclerosis (ICD10-I70.0). These results will be called to the ordering clinician or representative by the Radiologist Assistant, and communication documented in the PACS or [employer]. Electronically Signed   By: Jeronimo Greaves M.D.   On: 07/23/2023 14:16      Time coordinating discharge: over 30 minutes  SIGNED:  Sunnie Nielsen DO [facility]

## 2023-07-25 NOTE — Care Plan (Signed)
Pt SPO2 on room air  Sitting: 95% Standing: 95% Ambulating (down hallway and back): 96%

## 2023-07-25 NOTE — Plan of Care (Signed)

## 2023-08-03 DIAGNOSIS — K56609 Unspecified intestinal obstruction, unspecified as to partial versus complete obstruction: Secondary | ICD-10-CM | POA: Diagnosis not present

## 2023-08-11 ENCOUNTER — Other Ambulatory Visit: Payer: Self-pay

## 2023-08-11 DIAGNOSIS — C4361 Malignant melanoma of right upper limb, including shoulder: Secondary | ICD-10-CM

## 2023-08-12 ENCOUNTER — Encounter: Payer: Self-pay | Admitting: Hematology & Oncology

## 2023-08-12 ENCOUNTER — Inpatient Hospital Stay (HOSPITAL_BASED_OUTPATIENT_CLINIC_OR_DEPARTMENT_OTHER): Payer: Medicare Other | Admitting: Hematology & Oncology

## 2023-08-12 ENCOUNTER — Inpatient Hospital Stay: Payer: Medicare Other | Attending: Hematology & Oncology

## 2023-08-12 VITALS — BP 123/50 | HR 63 | Temp 98.4°F | Resp 20 | Ht 64.0 in | Wt 187.1 lb

## 2023-08-12 DIAGNOSIS — C4361 Malignant melanoma of right upper limb, including shoulder: Secondary | ICD-10-CM

## 2023-08-12 DIAGNOSIS — M7989 Other specified soft tissue disorders: Secondary | ICD-10-CM | POA: Insufficient documentation

## 2023-08-12 DIAGNOSIS — Z85820 Personal history of malignant melanoma of skin: Secondary | ICD-10-CM | POA: Insufficient documentation

## 2023-08-12 LAB — CBC WITH DIFFERENTIAL (CANCER CENTER ONLY)
Abs Immature Granulocytes: 0.02 10*3/uL (ref 0.00–0.07)
Basophils Absolute: 0.1 10*3/uL (ref 0.0–0.1)
Basophils Relative: 1 %
Eosinophils Absolute: 0.2 10*3/uL (ref 0.0–0.5)
Eosinophils Relative: 3 %
HCT: 45.4 % (ref 36.0–46.0)
Hemoglobin: 15.2 g/dL — ABNORMAL HIGH (ref 12.0–15.0)
Immature Granulocytes: 0 %
Lymphocytes Relative: 31 %
Lymphs Abs: 2.2 10*3/uL (ref 0.7–4.0)
MCH: 30.3 pg (ref 26.0–34.0)
MCHC: 33.5 g/dL (ref 30.0–36.0)
MCV: 90.4 fL (ref 80.0–100.0)
Monocytes Absolute: 0.8 10*3/uL (ref 0.1–1.0)
Monocytes Relative: 11 %
Neutro Abs: 3.8 10*3/uL (ref 1.7–7.7)
Neutrophils Relative %: 54 %
Platelet Count: 283 10*3/uL (ref 150–400)
RBC: 5.02 MIL/uL (ref 3.87–5.11)
RDW: 12.8 % (ref 11.5–15.5)
WBC Count: 7.1 10*3/uL (ref 4.0–10.5)
nRBC: 0 % (ref 0.0–0.2)

## 2023-08-12 LAB — LACTATE DEHYDROGENASE: LDH: 171 U/L (ref 98–192)

## 2023-08-12 LAB — CMP (CANCER CENTER ONLY)
ALT: 14 U/L (ref 0–44)
AST: 18 U/L (ref 15–41)
Albumin: 4.1 g/dL (ref 3.5–5.0)
Alkaline Phosphatase: 63 U/L (ref 38–126)
Anion gap: 9 (ref 5–15)
BUN: 8 mg/dL (ref 8–23)
CO2: 30 mmol/L (ref 22–32)
Calcium: 10.1 mg/dL (ref 8.9–10.3)
Chloride: 104 mmol/L (ref 98–111)
Creatinine: 0.74 mg/dL (ref 0.44–1.00)
GFR, Estimated: >60 mL/min (ref >60)
Glucose, Bld: 98 mg/dL (ref 70–99)
Potassium: 4.6 mmol/L (ref 3.5–5.1)
Sodium: 143 mmol/L (ref 135–145)
Total Bilirubin: 1.5 mg/dL — ABNORMAL HIGH (ref 0.0–1.2)
Total Protein: 7.3 g/dL (ref 6.5–8.1)

## 2023-08-12 NOTE — Progress Notes (Signed)
 Hematology and Oncology Follow Up Visit  Sara Bryan [id number] [birth year] 83 y.o. 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  We see her every year.  She is doing okay although she was hospitalized recently with a bowel obstruction.  It sounds like this probably was from scar tissue from past abdominal surgeries.  Currently, she feels well.  There is no abdominal pain.  She has had no change in bowel bladder habits.  I told her to take a laxative a day make sure that she keeps going and does not get constipated.  She has had no fever.  She has had no bleeding.  There has been no change in bowel or bladder habits.  She continues to work at [employer].  She totally enjoys this.  She has had no nausea or vomiting, outside of that associated with the bowel obstruction.  She has she had a mammogram done back in February.  I am impressed that she have not done at her age.  She has had no bleeding.  There is been no leg swelling although she has little to chronic swelling in the left lower leg.  Overall, I would say performance status is ECOG 0.     Wt Readings from Last 3 Encounters:  08/12/23 187 lb 1.3 oz (84.9 kg)  07/23/23 192 lb (87.1 kg)  08/12/22 190 lb 6.4 oz (86.4 kg)     Medications:  Allergies as of 08/12/2023       Reactions   Levaquin [levofloxacin] Other (See Comments)   Diarrhea/Muscle weakness/inability to lift arms/limbs   Aspirin Nausea And Vomiting, Nausea Only   Enteric is ok   Atorvastatin Other (See Comments)   Other reaction(s): myalgias   Codeine Nausea Only        Medication List        Accurate as of August 12, 2023  9:40 AM. If you have any questions, ask your nurse or doctor.          acetaminophen 500 MG tablet Commonly known as: TYLENOL Take 1,000 mg by mouth every 6 (six) hours as  needed for mild pain (pain score 1-3).   aspirin EC 81 MG tablet Take 81 mg by mouth daily.   CALCIUM PO Take 3 tablets by mouth daily.   CO Q-10 PO Take 100 mg by mouth daily.   fish oil-omega-3 fatty acids 1000 MG capsule Take 1 g by mouth daily.   ibuprofen 200 MG tablet Commonly known as: ADVIL Take 400 mg by mouth every 6 (six) hours as needed for moderate pain (pain score 4-6).   levothyroxine 75 MCG tablet Commonly known as: SYNTHROID Take 75 mcg by mouth daily before breakfast.   Magnesium 125 MG Caps daily.   ondansetron 8 MG tablet Commonly known as: ZOFRAN Take 1 tablet (8 mg total) by mouth daily as needed for nausea or vomiting.   pantoprazole 40 MG tablet Commonly known as: Protonix Take 1 tablet (40 mg total) by mouth daily.   PROBIOTIC DAILY PO Take 1 tablet by mouth daily.   rosuvastatin 5 MG tablet Commonly known as: CRESTOR Take 5 mg by mouth daily.   Vitamin D-3 125 MCG (5000 UT) Tabs Take 5,000 Units by mouth daily.   vitamin D3 25 MCG tablet Commonly known as: CHOLECALCIFEROL Take 1,000 Units by mouth  daily.        Allergies:  Allergies  Allergen Reactions   Levaquin [Levofloxacin] Other (See Comments)    Diarrhea/Muscle weakness/inability to lift arms/limbs   Aspirin Nausea And Vomiting and Nausea Only    Enteric is ok   Atorvastatin Other (See Comments)    Other reaction(s): myalgias   Codeine Nausea Only    Past Medical History, Surgical history, Social history, and Family History were reviewed and updated.  Review of Systems: Review of Systems  Constitutional: Negative.   HENT: Negative.    Eyes: Negative.   Respiratory: Negative.    Cardiovascular: Negative.   Gastrointestinal:  Positive for heartburn.  Genitourinary: Negative.   Musculoskeletal: Negative.   Skin: Negative.   Neurological: Negative.   Endo/Heme/Allergies: Negative.   Psychiatric/Behavioral: Negative.      Physical Exam:  height is 5\' 4"   (1.626 m) and weight is 187 lb 1.3 oz (84.9 kg). Her oral temperature is 98.4 F (36.9 C). Her blood pressure is 123/50 (abnormal) and her pulse is 63. Her respiration is 20 and oxygen saturation is 100%.   Wt Readings from Last 3 Encounters:  08/12/23 187 lb 1.3 oz (84.9 kg)  07/23/23 192 lb (87.1 kg)  08/12/22 190 lb 6.4 oz (86.4 kg)    Physical Exam Vitals reviewed.  HENT:     Head: Normocephalic and atraumatic.  Eyes:     Pupils: Pupils are equal, round, and reactive to light.  Cardiovascular:     Rate and Rhythm: Normal rate and regular rhythm.     Heart sounds: Normal heart sounds.  Pulmonary:     Effort: Pulmonary effort is normal.     Breath sounds: Normal breath sounds.  Abdominal:     General: Bowel sounds are normal.     Palpations: Abdomen is soft.  Musculoskeletal:        General: No tenderness or deformity. Normal range of motion.     Cervical back: Normal range of motion.  Lymphadenopathy:     Cervical: No cervical adenopathy.  Skin:    General: Skin is warm and dry.     Findings: No erythema or rash.  Neurological:     Mental Status: She is alert and oriented to person, place, and time.  Psychiatric:        Behavior: Behavior normal.        Thought Content: Thought content normal.        Judgment: Judgment normal.      Lab Results  Component Value Date   WBC 7.1 08/12/2023   HGB 15.2 (H) 08/12/2023   HCT 45.4 08/12/2023   MCV 90.4 08/12/2023   PLT 283 08/12/2023   No results found for: "FERRITIN", "IRON", "TIBC", "UIBC", "IRONPCTSAT" Lab Results  Component Value Date   RBC 5.02 08/12/2023   No results found for: "KPAFRELGTCHN", "LAMBDASER", "KAPLAMBRATIO" No results found for: "IGGSERUM", "IGA", "IGMSERUM" No results found for: "TOTALPROTELP", "ALBUMINELP", "A1GS", "A2GS", "BETS", "BETA2SER", "GAMS", "MSPIKE", "SPEI"   Chemistry      Component Value Date/Time   NA 143 08/12/2023 0846   NA 133 (L) 08/04/2016 1459   NA 142 02/26/2016 0921    K 4.6 08/12/2023 0846   K 4.0 08/04/2016 1459   K 4.1 02/26/2016 0921   CL 104 08/12/2023 0846   CL 103 08/04/2016 1459   CL 105 02/26/2015 0807   CO2 30 08/12/2023 0846   CO2 26 08/04/2016 1459   CO2 25 02/26/2016 0921   BUN 8 08/12/2023  0846   BUN 18 08/04/2016 1459   BUN 18.0 02/26/2016 0921   CREATININE 0.74 08/12/2023 0846   CREATININE 0.64 08/04/2016 1459   CREATININE 0.8 02/26/2016 0921      Component Value Date/Time   CALCIUM 10.1 08/12/2023 0846   CALCIUM 9.4 08/04/2016 1459   CALCIUM 9.7 02/26/2016 0921   ALKPHOS 63 08/12/2023 0846   ALKPHOS 80 08/04/2016 1459   ALKPHOS 75 02/26/2016 0921   AST 18 08/12/2023 0846   AST 23 02/26/2016 0921   ALT 14 08/12/2023 0846   ALT 25 02/26/2016 0921   BILITOT 1.5 (H) 08/12/2023 0846   BILITOT 1.62 (H) 02/26/2016 0921     Impression and Plan: Sara Bryan is 83 yo white female with history of a stage IB melanoma of the right arm resection back in November 2012. So far, she has done well and there has been no evidence of recurrence.   I am happy that she is still doing so well.  We will plan to get her back in another year.  I do not think we need any radiologic studies.    Josph Macho, MD 3/5/20259:40 AM

## 2023-08-17 DIAGNOSIS — D3132 Benign neoplasm of left choroid: Secondary | ICD-10-CM | POA: Diagnosis not present

## 2023-08-17 DIAGNOSIS — Z961 Presence of intraocular lens: Secondary | ICD-10-CM | POA: Diagnosis not present

## 2023-08-17 DIAGNOSIS — H52203 Unspecified astigmatism, bilateral: Secondary | ICD-10-CM | POA: Diagnosis not present

## 2023-08-17 DIAGNOSIS — H26493 Other secondary cataract, bilateral: Secondary | ICD-10-CM | POA: Diagnosis not present

## 2023-08-31 DIAGNOSIS — Z0000 Encounter for general adult medical examination without abnormal findings: Secondary | ICD-10-CM | POA: Diagnosis not present

## 2023-08-31 DIAGNOSIS — M85852 Other specified disorders of bone density and structure, left thigh: Secondary | ICD-10-CM | POA: Diagnosis not present

## 2023-08-31 DIAGNOSIS — M1711 Unilateral primary osteoarthritis, right knee: Secondary | ICD-10-CM | POA: Diagnosis not present

## 2023-08-31 DIAGNOSIS — E782 Mixed hyperlipidemia: Secondary | ICD-10-CM | POA: Diagnosis not present

## 2023-08-31 DIAGNOSIS — I2510 Atherosclerotic heart disease of native coronary artery without angina pectoris: Secondary | ICD-10-CM | POA: Diagnosis not present

## 2023-08-31 DIAGNOSIS — Z8719 Personal history of other diseases of the digestive system: Secondary | ICD-10-CM | POA: Diagnosis not present

## 2023-08-31 DIAGNOSIS — R911 Solitary pulmonary nodule: Secondary | ICD-10-CM | POA: Diagnosis not present

## 2023-08-31 DIAGNOSIS — M545 Low back pain, unspecified: Secondary | ICD-10-CM | POA: Diagnosis not present

## 2023-08-31 DIAGNOSIS — E559 Vitamin D deficiency, unspecified: Secondary | ICD-10-CM | POA: Diagnosis not present

## 2023-08-31 DIAGNOSIS — E039 Hypothyroidism, unspecified: Secondary | ICD-10-CM | POA: Diagnosis not present

## 2023-11-26 DIAGNOSIS — C4441 Basal cell carcinoma of skin of scalp and neck: Secondary | ICD-10-CM | POA: Diagnosis not present

## 2023-11-26 DIAGNOSIS — L905 Scar conditions and fibrosis of skin: Secondary | ICD-10-CM | POA: Diagnosis not present

## 2023-11-26 DIAGNOSIS — D225 Melanocytic nevi of trunk: Secondary | ICD-10-CM | POA: Diagnosis not present

## 2023-11-26 DIAGNOSIS — Z08 Encounter for follow-up examination after completed treatment for malignant neoplasm: Secondary | ICD-10-CM | POA: Diagnosis not present

## 2023-11-26 DIAGNOSIS — D492 Neoplasm of unspecified behavior of bone, soft tissue, and skin: Secondary | ICD-10-CM | POA: Diagnosis not present

## 2023-11-26 DIAGNOSIS — Z85828 Personal history of other malignant neoplasm of skin: Secondary | ICD-10-CM | POA: Diagnosis not present

## 2023-11-26 DIAGNOSIS — L814 Other melanin hyperpigmentation: Secondary | ICD-10-CM | POA: Diagnosis not present

## 2023-11-26 DIAGNOSIS — L821 Other seborrheic keratosis: Secondary | ICD-10-CM | POA: Diagnosis not present

## 2023-12-29 DIAGNOSIS — C4441 Basal cell carcinoma of skin of scalp and neck: Secondary | ICD-10-CM | POA: Diagnosis not present

## 2024-02-19 DIAGNOSIS — H0100A Unspecified blepharitis right eye, upper and lower eyelids: Secondary | ICD-10-CM | POA: Diagnosis not present

## 2024-02-19 DIAGNOSIS — H10411 Chronic giant papillary conjunctivitis, right eye: Secondary | ICD-10-CM | POA: Diagnosis not present

## 2024-03-04 DIAGNOSIS — R911 Solitary pulmonary nodule: Secondary | ICD-10-CM | POA: Diagnosis not present

## 2024-03-04 DIAGNOSIS — E782 Mixed hyperlipidemia: Secondary | ICD-10-CM | POA: Diagnosis not present

## 2024-03-04 DIAGNOSIS — M1711 Unilateral primary osteoarthritis, right knee: Secondary | ICD-10-CM | POA: Diagnosis not present

## 2024-03-04 DIAGNOSIS — M549 Dorsalgia, unspecified: Secondary | ICD-10-CM | POA: Diagnosis not present

## 2024-03-04 DIAGNOSIS — I2510 Atherosclerotic heart disease of native coronary artery without angina pectoris: Secondary | ICD-10-CM | POA: Diagnosis not present

## 2024-03-04 DIAGNOSIS — Z23 Encounter for immunization: Secondary | ICD-10-CM | POA: Diagnosis not present

## 2024-03-04 DIAGNOSIS — Z8719 Personal history of other diseases of the digestive system: Secondary | ICD-10-CM | POA: Diagnosis not present

## 2024-03-04 DIAGNOSIS — Z85820 Personal history of malignant melanoma of skin: Secondary | ICD-10-CM | POA: Diagnosis not present

## 2024-03-04 DIAGNOSIS — M85852 Other specified disorders of bone density and structure, left thigh: Secondary | ICD-10-CM | POA: Diagnosis not present

## 2024-03-04 DIAGNOSIS — E039 Hypothyroidism, unspecified: Secondary | ICD-10-CM | POA: Diagnosis not present

## 2024-03-04 DIAGNOSIS — E559 Vitamin D deficiency, unspecified: Secondary | ICD-10-CM | POA: Diagnosis not present

## 2024-03-04 DIAGNOSIS — R252 Cramp and spasm: Secondary | ICD-10-CM | POA: Diagnosis not present

## 2024-03-21 ENCOUNTER — Other Ambulatory Visit (HOSPITAL_BASED_OUTPATIENT_CLINIC_OR_DEPARTMENT_OTHER): Payer: Self-pay

## 2024-03-21 MED ORDER — COMIRNATY 30 MCG/0.3ML IM SUSY
0.3000 mL | PREFILLED_SYRINGE | Freq: Once | INTRAMUSCULAR | 0 refills | Status: AC
  Filled 2024-03-21: qty 0.3, 1d supply, fill #0

## 2024-08-08 ENCOUNTER — Other Ambulatory Visit

## 2024-08-08 ENCOUNTER — Ambulatory Visit: Admitting: Family
